# Patient Record
Sex: Male | Born: 1965 | Race: White | Hispanic: No | Marital: Married | Smoking: Never smoker
Health system: Southern US, Community
[De-identification: ages and names within clinical notes are randomized; demographics above are authoritative.]

## PROBLEM LIST (undated history)

## (undated) DIAGNOSIS — Z87442 Personal history of urinary calculi: Secondary | ICD-10-CM

## (undated) DIAGNOSIS — K219 Gastro-esophageal reflux disease without esophagitis: Secondary | ICD-10-CM

## (undated) DIAGNOSIS — N200 Calculus of kidney: Secondary | ICD-10-CM

## (undated) DIAGNOSIS — N529 Male erectile dysfunction, unspecified: Secondary | ICD-10-CM

## (undated) HISTORY — DX: Calculus of kidney: N20.0

## (undated) HISTORY — DX: Male erectile dysfunction, unspecified: N52.9

---
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
## 1986-09-16 HISTORY — PX: LEG SURGERY: SHX1003

## 2001-09-16 HISTORY — PX: SHOULDER SURGERY: SHX246

## 2004-09-26 ENCOUNTER — Emergency Department: Payer: Self-pay | Admitting: Emergency Medicine

## 2008-01-10 ENCOUNTER — Other Ambulatory Visit: Payer: Self-pay

## 2008-01-10 ENCOUNTER — Observation Stay: Payer: Self-pay | Admitting: Internal Medicine

## 2010-09-25 ENCOUNTER — Observation Stay: Payer: Self-pay | Admitting: Internal Medicine

## 2010-12-12 ENCOUNTER — Ambulatory Visit: Payer: Self-pay | Admitting: Family Medicine

## 2011-01-04 ENCOUNTER — Ambulatory Visit: Payer: Self-pay | Admitting: Internal Medicine

## 2012-07-27 ENCOUNTER — Emergency Department: Payer: Self-pay | Admitting: Emergency Medicine

## 2012-07-27 LAB — URINALYSIS, COMPLETE
Glucose,UR: NEGATIVE mg/dL (ref 0–75)
Leukocyte Esterase: NEGATIVE
Nitrite: NEGATIVE
Protein: NEGATIVE
RBC,UR: 2 /HPF (ref 0–5)
Squamous Epithelial: NONE SEEN
WBC UR: NONE SEEN /HPF (ref 0–5)

## 2012-07-27 LAB — COMPREHENSIVE METABOLIC PANEL
Albumin: 4.6 g/dL (ref 3.4–5.0)
Anion Gap: 3 — ABNORMAL LOW (ref 7–16)
Bilirubin,Total: 0.4 mg/dL (ref 0.2–1.0)
Calcium, Total: 9.2 mg/dL (ref 8.5–10.1)
Creatinine: 0.9 mg/dL (ref 0.60–1.30)
EGFR (African American): >60
EGFR (Non-African Amer.): >60
Glucose: 100 mg/dL — ABNORMAL HIGH (ref 65–99)
Osmolality: 274 (ref 275–301)
Potassium: 3.7 mmol/L (ref 3.5–5.1)
Sodium: 137 mmol/L (ref 136–145)
Total Protein: 7.6 g/dL (ref 6.4–8.2)

## 2012-07-27 LAB — LIPASE, BLOOD: Lipase: 103 U/L (ref 73–393)

## 2012-07-27 LAB — CBC
HCT: 45.5 % (ref 40.0–52.0)
HGB: 15.6 g/dL (ref 13.0–18.0)
MCHC: 34.3 g/dL (ref 32.0–36.0)
RDW: 12.7 % (ref 11.5–14.5)

## 2013-07-31 ENCOUNTER — Emergency Department: Payer: Self-pay | Admitting: Emergency Medicine

## 2017-06-24 ENCOUNTER — Ambulatory Visit (INDEPENDENT_AMBULATORY_CARE_PROVIDER_SITE_OTHER): Payer: Commercial Managed Care - PPO | Admitting: Family Medicine

## 2017-06-24 ENCOUNTER — Encounter: Payer: Self-pay | Admitting: Family Medicine

## 2017-06-24 VITALS — BP 136/86 | HR 68 | Temp 98.2°F | Resp 14 | Ht 69.0 in | Wt 196.0 lb

## 2017-06-24 DIAGNOSIS — N529 Male erectile dysfunction, unspecified: Secondary | ICD-10-CM | POA: Insufficient documentation

## 2017-06-24 DIAGNOSIS — F439 Reaction to severe stress, unspecified: Secondary | ICD-10-CM

## 2017-06-24 DIAGNOSIS — E559 Vitamin D deficiency, unspecified: Secondary | ICD-10-CM | POA: Diagnosis not present

## 2017-06-24 DIAGNOSIS — Z0000 Encounter for general adult medical examination without abnormal findings: Secondary | ICD-10-CM | POA: Diagnosis not present

## 2017-06-24 DIAGNOSIS — Z1211 Encounter for screening for malignant neoplasm of colon: Secondary | ICD-10-CM

## 2017-06-24 HISTORY — DX: Male erectile dysfunction, unspecified: N52.9

## 2017-06-24 LAB — CBC WITH DIFFERENTIAL/PLATELET
BASOS ABS: 32 {cells}/uL (ref 0–200)
Basophils Relative: 0.7 %
EOS ABS: 9 {cells}/uL — AB (ref 15–500)
Eosinophils Relative: 0.2 %
HCT: 45.2 % (ref 38.5–50.0)
Hemoglobin: 15.7 g/dL (ref 13.2–17.1)
Lymphs Abs: 1458 cells/uL (ref 850–3900)
MCH: 30.7 pg (ref 27.0–33.0)
MCHC: 34.7 g/dL (ref 32.0–36.0)
MCV: 88.3 fL (ref 80.0–100.0)
MONOS PCT: 6.3 %
MPV: 9.2 fL (ref 7.5–12.5)
NEUTROS PCT: 61.1 %
Neutro Abs: 2811 cells/uL (ref 1500–7800)
PLATELETS: 257 10*3/uL (ref 140–400)
RBC: 5.12 10*6/uL (ref 4.20–5.80)
RDW: 12.6 % (ref 11.0–15.0)
TOTAL LYMPHOCYTE: 31.7 %
WBC mixed population: 290 cells/uL (ref 200–950)
WBC: 4.6 10*3/uL (ref 3.8–10.8)

## 2017-06-24 LAB — COMPLETE METABOLIC PANEL WITH GFR
AG RATIO: 1.9 (calc) (ref 1.0–2.5)
ALBUMIN MSPROF: 4.8 g/dL (ref 3.6–5.1)
ALKALINE PHOSPHATASE (APISO): 68 U/L (ref 40–115)
ALT: 14 U/L (ref 9–46)
AST: 15 U/L (ref 10–40)
BILIRUBIN TOTAL: 0.7 mg/dL (ref 0.2–1.2)
BUN: 15 mg/dL (ref 7–25)
CO2: 28 mmol/L (ref 20–32)
Calcium: 9.9 mg/dL (ref 8.6–10.3)
Chloride: 105 mmol/L (ref 98–110)
Creat: 0.84 mg/dL (ref 0.60–1.35)
GFR, Est African American: 119 mL/min/{1.73_m2} (ref >=60)
GFR, Est Non African American: 103 mL/min/{1.73_m2} (ref >=60)
GLUCOSE: 90 mg/dL (ref 65–99)
Globulin: 2.5 g/dL (calc) (ref 1.9–3.7)
POTASSIUM: 4.1 mmol/L (ref 3.5–5.3)
Sodium: 141 mmol/L (ref 135–146)
Total Protein: 7.3 g/dL (ref 6.1–8.1)

## 2017-06-24 LAB — LIPID PANEL
CHOLESTEROL: 162 mg/dL (ref <200)
HDL: 39 mg/dL — ABNORMAL LOW (ref >40)
LDL CHOLESTEROL (CALC): 100 mg/dL — AB
Non-HDL Cholesterol (Calc): 123 mg/dL (calc) (ref <130)
Total CHOL/HDL Ratio: 4.2 (calc) (ref <5.0)
Triglycerides: 132 mg/dL (ref <150)

## 2017-06-24 MED ORDER — SILDENAFIL CITRATE 20 MG PO TABS: 20.0000 mg | ORAL_TABLET | Freq: Every day | ORAL | 5 refills | Status: DC | PRN

## 2017-06-24 NOTE — Patient Instructions (Addendum)

## 2017-06-24 NOTE — Assessment & Plan Note (Addendum)
Discussed risk of vascular disease associated with ER, vascular cause; suggested stress testing, cardiology work-up; patient declined; will check lipids, glucose

## 2017-06-24 NOTE — Assessment & Plan Note (Signed)
Check level, replace/supplement if needed

## 2017-06-24 NOTE — Assessment & Plan Note (Signed)
Order colonoscopy, after he turns 51

## 2017-06-24 NOTE — Progress Notes (Signed)
Patient ID: Jason Duncan, male   DOB: [birth year], 51 y.o.   MRN: [id number]   Subjective:   Jason Duncan is a 51 y.o. male here for a complete physical exam  Interim issues since last visit: none  He has ED; he would like Cialis; he has already had a stress test, passed with flying colors; discussed risk of coronary artery disease; arms will go to sleep, tingling, can't lay on left side, if he lays for too long on the left side, arm goes to sleep; feet go to burning; got so hot and ran water on them, felt so hot  USPSTF grade A and B recommendations Depression:  Depression screen [facility] 2/9 06/24/2017  Decreased Interest 0  Down, Depressed, Hopeless 2  PHQ - 2 Score 2  Altered sleeping 3  Tired, decreased energy 0  Change in appetite 0  Feeling bad or failure about yourself  1  Trouble concentrating 0  Moving slowly or fidgety/restless 0  Suicidal thoughts 1  PHQ-9 Score 7  Difficult doing work/chores Somewhat difficult  patient specifically denies SI/HI; would not act; no plan; just has issues, can't win for losing sometimes  Hypertension: does eat salt BP Readings from Last 3 Encounters:  06/24/17 136/86   Obesity: Wt Readings from Last 3 Encounters:  06/24/17 196 lb (88.9 kg)   BMI Readings from Last 3 Encounters:  06/24/17 28.94 kg/m    Alcohol: no Tobacco use: nonsmoker HIV, hep B, hep C: not intersted Married STD testing and prevention (chl/gon/syphilis): not interested Lipids and glucose: check todfay Colorectal cancer: no fam hx  Prostate cancer: at age 18 Lung cancer:  nonsmoker AAA: n/a Aspirin: consider at 62, check lipids Diet: typical American eater; lots of fruits and veggies; eats what he wants to eat, fried foods Exercise: lots of walking at work, [employer], walking through [employer] Skin cancer: one on his butt, but getting bigger, right, not symptomatic and not bleeding  Past Medical History:  Diagnosis Date  . Erectile dysfunction    . Erectile dysfunction 06/24/2017  . Kidney stones    Past Surgical History:  Procedure Laterality Date  . EXTERNAL EAR SURGERY Bilateral 1973  . LEG SURGERY Right 1988   rods placed due to broken leg  . SHOULDER SURGERY Right 2003   Family History  Problem Relation Age of Onset  . Hypertension Mother   . Pneumonia Maternal Grandmother   . Cancer Maternal Grandfather        lung  . Heart disease Brother        29 years old, died from a heart attack   Social History  Substance Use Topics  . Smoking status: Never Smoker  . Smokeless tobacco: Current User    Types: Chew  . Alcohol use No   Review of Systems  Constitutional: Negative for unexpected weight change.  Cardiovascular: Negative for chest pain.  was kept for 2 days in the hospital checked out, no heart attack  Objective:   Vitals:   06/24/17 0922  BP: 136/86  Pulse: 68  Resp: 14  Temp: 98.2 F (36.8 C)  TempSrc: Oral  SpO2: 97%  Weight: 196 lb (88.9 kg)  Height: 5\' 9"  (1.753 m)   Body mass index is 28.94 kg/m. Wt Readings from Last 3 Encounters:  06/24/17 196 lb (88.9 kg)   Physical Exam  Constitutional: He appears well-developed and well-nourished. No distress.  HENT:  Head: Normocephalic and atraumatic.  Nose: Nose normal.  Mouth/Throat:  Oropharynx is clear and moist.  Eyes: EOM are normal. No scleral icterus.  Neck: No JVD present. No thyromegaly present.  Cardiovascular: Normal rate, regular rhythm and normal heart sounds.   Pulmonary/Chest: Effort normal and breath sounds normal. No respiratory distress. He has no wheezes. He has no rales.  Abdominal: Soft. Bowel sounds are normal. He exhibits no distension. There is no tenderness. There is no guarding.  Musculoskeletal: Normal range of motion. He exhibits no edema.  Lymphadenopathy:    He has no cervical adenopathy.  Neurological: He is alert. He displays normal reflexes. He exhibits normal muscle tone. Coordination normal.  Skin: Skin is  warm and dry. No rash noted. He is not diaphoretic. No erythema. No pallor.  Psychiatric: He has a normal mood and affect. His behavior is normal. Judgment and thought content normal.    Assessment/Plan:   Problem List Items Addressed This Visit      Genitourinary   Erectile dysfunction    Discussed risk of vascular disease associated with ER, vascular cause; suggested stress testing, cardiology work-up; patient declined; will check lipids, glucose        Other   Vitamin D deficiency    Check level, replace/supplement if needed      Relevant Orders   VITAMIN D 25 Hydroxy (Vit-D Deficiency, Fractures) (Completed)   Colon cancer screening    Order colonoscopy, after he turns 51      Relevant Orders   Ambulatory referral to Gastroenterology   Preventative health care - Primary    USPSTF grade A and B recommendations reviewed with patient; age-appropriate recommendations, preventive care, screening tests, etc discussed and encouraged; healthy living encouraged; see AVS for patient education given to patient       Relevant Orders   CBC with Differential/Platelet (Completed)   COMPLETE METABOLIC PANEL WITH GFR (Completed)   Lipid panel (Completed)   TSH (Completed)    Other Visit Diagnoses    Stress       patient denies SI/HI; did not want medicine or counseling when offered; will check TSH, vit D      Meds ordered this encounter  Medications  . sildenafil (REVATIO) 20 MG tablet    Sig: Take 1-3 tablets (20-60 mg total) by mouth daily as needed.    Dispense:  30 tablet    Refill:  5   Orders Placed This Encounter  Procedures  . CBC with Differential/Platelet  . COMPLETE METABOLIC PANEL WITH GFR  . Lipid panel  . TSH  . VITAMIN D 25 Hydroxy (Vit-D Deficiency, Fractures)  . Ambulatory referral to Gastroenterology    Referral Priority:   Routine    Referral Type:   Consultation    Referral Reason:   Specialty Services Required    Number of Visits Requested:   1     Follow up plan: Return in about 1 year (around 06/24/2018) for complete physical.  An After Visit Summary was printed and given to the patient.

## 2017-06-25 LAB — VITAMIN D 25 HYDROXY (VIT D DEFICIENCY, FRACTURES): Vit D, 25-Hydroxy: 27 ng/mL — ABNORMAL LOW (ref 30–100)

## 2017-06-25 LAB — TSH: TSH: 1.71 mIU/L (ref 0.40–4.50)

## 2017-06-25 NOTE — Assessment & Plan Note (Signed)
USPSTF grade A and B recommendations reviewed with patient; age-appropriate recommendations, preventive care, screening tests, etc discussed and encouraged; healthy living encouraged; see AVS for patient education given to patient  

## 2017-07-28 ENCOUNTER — Other Ambulatory Visit: Payer: Self-pay | Admitting: Family Medicine

## 2017-07-28 MED ORDER — SILDENAFIL CITRATE 50 MG PO TABS: 25.0000 mg | ORAL_TABLET | Freq: Every day | ORAL | 5 refills | Status: DC | PRN

## 2017-07-28 NOTE — Progress Notes (Signed)
Sildenafil not covered by insurance; switch to viagra

## 2017-10-03 ENCOUNTER — Other Ambulatory Visit: Payer: Self-pay

## 2017-10-03 DIAGNOSIS — Z1211 Encounter for screening for malignant neoplasm of colon: Secondary | ICD-10-CM

## 2017-10-03 NOTE — Progress Notes (Signed)
Gastroenterology Pre-Procedure Review  Request Date: 1/25 Requesting Physician: Dr. Bonna Gains  PATIENT REVIEW QUESTIONS: The patient responded to the following health history questions as indicated:    1. Are you having any GI issues? no 2. Do you have a personal history of Polyps? no 3. Do you have a family history of Colon Cancer or Polyps? yes (No) 4. Diabetes Mellitus? no 5. Joint replacements in the past 12 months?no 6. Major health problems in the past 3 months?no 7. Any artificial heart valves, MVP, or defibrillator?no    MEDICATIONS & ALLERGIES:    Patient reports the following regarding taking any anticoagulation/antiplatelet therapy:   Plavix, Coumadin, Eliquis, Xarelto, Lovenox, Pradaxa, Brilinta, or Effient? no Aspirin? no  Patient confirms/reports the following medications:  Current Outpatient Medications  Medication Sig Dispense Refill  . sildenafil (VIAGRA) 50 MG tablet Take 0.5-1 tablets (25-50 mg total) daily as needed by mouth for erectile dysfunction. 10 tablet 5   No current facility-administered medications for this visit.     Patient confirms/reports the following allergies:  Allergies  Allergen Reactions  . Sulfur     No orders of the defined types were placed in this encounter.   AUTHORIZATION INFORMATION Primary Insurance: 1D#: Group #:  Secondary Insurance: 1D#: Group #:  SCHEDULE INFORMATION: Date: 1/25 Time: Location: [place]

## 2017-10-08 ENCOUNTER — Telehealth: Payer: Self-pay | Admitting: Gastroenterology

## 2017-10-08 NOTE — Telephone Encounter (Signed)
Notified [facility] regarding cancellation.

## 2017-10-08 NOTE — Telephone Encounter (Signed)
Patient has to cancel procedure on 10/10/17 with Dr. Bonna Gains due to family emergency.

## 2017-10-10 ENCOUNTER — Encounter: Admission: RE | Payer: Self-pay | Source: Ambulatory Visit

## 2017-10-10 ENCOUNTER — Ambulatory Visit
Admission: RE | Admit: 2017-10-10 | Payer: Commercial Managed Care - PPO | Source: Ambulatory Visit | Admitting: Gastroenterology

## 2017-10-10 SURGERY — COLONOSCOPY WITH PROPOFOL
Anesthesia: General

## 2018-07-03 ENCOUNTER — Telehealth: Payer: Self-pay | Admitting: Family Medicine

## 2018-07-03 NOTE — Telephone Encounter (Signed)
Patient is due for his physical Please schedule Thank you

## 2018-07-06 NOTE — Telephone Encounter (Signed)
Called [phone] @ 8:16 informing pt that prescription has been sent to pharmacy. Asked pt to return call to schedule appt for physical. Pt does have the option to see Benjamine Mola if Dr Sanda Klein is booked.

## 2018-07-08 NOTE — Telephone Encounter (Signed)
Pt is sch to see elizabeth tomorrow 07-09-18 at 9 am

## 2018-07-09 ENCOUNTER — Encounter: Payer: Self-pay | Admitting: Nurse Practitioner

## 2018-07-09 ENCOUNTER — Ambulatory Visit (INDEPENDENT_AMBULATORY_CARE_PROVIDER_SITE_OTHER): Payer: Commercial Managed Care - PPO | Admitting: Nurse Practitioner

## 2018-07-09 VITALS — BP 150/90 | HR 67 | Temp 98.5°F | Resp 16 | Ht 68.0 in | Wt 188.8 lb

## 2018-07-09 DIAGNOSIS — Z131 Encounter for screening for diabetes mellitus: Secondary | ICD-10-CM

## 2018-07-09 DIAGNOSIS — Z1211 Encounter for screening for malignant neoplasm of colon: Secondary | ICD-10-CM | POA: Diagnosis not present

## 2018-07-09 DIAGNOSIS — R0683 Snoring: Secondary | ICD-10-CM

## 2018-07-09 DIAGNOSIS — R350 Frequency of micturition: Secondary | ICD-10-CM

## 2018-07-09 DIAGNOSIS — Z0000 Encounter for general adult medical examination without abnormal findings: Secondary | ICD-10-CM

## 2018-07-09 DIAGNOSIS — H6123 Impacted cerumen, bilateral: Secondary | ICD-10-CM

## 2018-07-09 DIAGNOSIS — E559 Vitamin D deficiency, unspecified: Secondary | ICD-10-CM

## 2018-07-09 DIAGNOSIS — R030 Elevated blood-pressure reading, without diagnosis of hypertension: Secondary | ICD-10-CM

## 2018-07-09 DIAGNOSIS — Z13220 Encounter for screening for lipoid disorders: Secondary | ICD-10-CM

## 2018-07-09 DIAGNOSIS — G4700 Insomnia, unspecified: Secondary | ICD-10-CM

## 2018-07-09 DIAGNOSIS — R3915 Urgency of urination: Secondary | ICD-10-CM

## 2018-07-09 DIAGNOSIS — Z125 Encounter for screening for malignant neoplasm of prostate: Secondary | ICD-10-CM

## 2018-07-09 DIAGNOSIS — R351 Nocturia: Secondary | ICD-10-CM

## 2018-07-09 MED ORDER — TAMSULOSIN HCL 0.4 MG PO CAPS: 0.4000 mg | ORAL_CAPSULE | Freq: Every day | ORAL | 0 refills | Status: DC
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
## 2018-07-09 NOTE — Patient Instructions (Addendum)
It was great meeting you today Jason Duncan,  Here is a review of some of what we have discussed and some recommendations:   - Goal is 5 fruits and or vegetables a day: for now one serving of vegetable and fruit a day and slowly increase - Goal is for 8 glasses of water/ 64 ounces a day: for now 2 bottles of water a day and slowly increase - Cut down on salt.  - Increase activity to have some regular activity in during the week- 30 minutes 2 times a week; increase slowly to goal of 150 minutes a week; ex. Walking with the grandkids, swimming, jogging ect. Exercise 2 hours before sleep helps body rest faster as well.  - Please expect a phone call for your sleep study and colonscopy scheduling. If you do not receive a phone call within the next month let us know.  - please do not use q-tips as it pushed the ear wax further in your ear. - Please take a multi-vitamin with calcium, vitamin D and magnesium to help with you joint pains.  - Take your new medication to help with your urinary flow once a day (I recommend in the morning); let us know if you have any problems. - Follow-up in one month for blood pressure   _________________ Your goal blood pressure is less than 140 mmHg on top and under 90 on the bottom. Try to follow the DASH guidelines (DASH stands for Dietary Approaches to Stop Hypertension) Try to limit the sodium in your diet.  Ideally, consume less than 1.5 grams (less than 1,500mg ) per day. Do not add salt when cooking or at the table.  Check the sodium amount on labels when shopping, and choose items lower in sodium when given a choice. Avoid or limit foods that already contain a lot of sodium. Eat a diet rich in fruits and vegetables and whole grains. ___________________________ Sleep Hygiene Tips 1) Get regular. One of the best ways to train your body to sleep well is to go to bed and get up at more or less the same time every day, even on weekends and days off! This regular  rhythm will make you feel better and will give your body something to work from. 2) Sleep when sleepy. Only try to sleep when you actually feel tired or sleepy, rather than spending too much time awake in bed. 3) Get up & try again. If you haven't been able to get to sleep after about 20 minutes or more, get up and do something calming or boring until you feel sleepy, then return to bed and try again. Sit quietly on the couch with the lights off (bright light will tell your brain that it is time to wake up), or read something boring like the phone book. Avoid doing anything that is too stimulating or interesting, as this will wake you up even more. 4) Avoid caffeine & nicotine. It is best to avoid consuming any caffeine (in coffee, tea, cola drinks, chocolate, and some medications) or nicotine (cigarettes) for at least 4-6 hours before going to bed. These substances act as stimulants and interfere with the ability to fall asleep 5) Avoid alcohol. It is also best to avoid alcohol for at least 4-6 hours before going to bed. Many people believe that alcohol is relaxing and helps them to get to sleep at first, but it actually interrupts the quality of sleep. 6) Bed is for sleeping. Try not to use your bed for  anything other than sleeping and sex, so that your body comes to associate bed with sleep. If you use bed as a place to watch TV, eat, read, work on your laptop, pay bills, and other things, your body will not learn this Connection. 7) No naps. It is best to avoid taking naps during the day, to make sure that you are tired at bedtime. If you can't make it through the day without a nap, make sure it is for less than an hour and before 3pm. 8) Sleep rituals. You can develop your own rituals of things to remind your body that it is time to sleep - some people find it useful to do relaxing stretches or breathing exercises for 15 minutes before bed each night, or sit calmly with a  cup of caffeine-free tea. 9) Bathtime. Having a hot bath 1-2 hours before bedtime can be useful, as it will raise your body temperature, causing you to feel sleepy as your body temperature drops again. Research shows that sleepiness is associated with a drop in body temperature. 10) No clock-watching. Many people who struggle with sleep tend to watch the clock too much. Frequently checking the clock during the night can wake you up (especially if you turn on the light to read the time) and reinforces negative thoughts such as "Oh no, look how late it is, I'll never get to sleep" or "it's so early, I have only slept for 5 hours, this is terrible." 11) Use a sleep diary. This worksheet can be a useful way of making sure you have the right facts about your sleep, rather than making assumptions. Because a diary involves watching the clock (see point 10) it is a good idea to only use it for two weeks to get an idea of what is going and then perhaps two months down the track to see how you are progressing. 12) Exercise. Regular exercise is a good idea to help with good sleep, but try not to do strenuous exercise in the 4 hours before bedtime. Morning walks are a great way to start the day feeling refreshed! 13) Eat right. A healthy, balanced diet will help you to sleep well, but timing is important. Some people find that a very empty stomach at bedtime is distracting, so it can be useful to have a light snack, but a heavy meal soon before bed can also interrupt sleep. Some people recommend a warm glass of milk, which contains tryptophan, which acts as a natural sleep inducer. 14) The right space. It is very important that your bed and bedroom are quiet and comfortable for sleeping. A cooler room with enough blankets to stay warm is best, and make sure you have curtains or an eyemask to block out early morning light and earplugs if there is noise outside your room. 15) Keep daytime  routine the same. Even if you have a bad night sleep and are tired it is important that you try to keep your daytime activities the same as you had planned. That is, don't avoid activities because you feel tired. This can reinforce the insomnia. __________________  For earwax build up use ONE of these over the counter options to soften up with ear wax in your ear: mineral oil drops, docusate (liquid), debrox. Place a 2 drops in your ear while laying on your left side- Keep laying for 30-60 minutes on that side after application. Can use once daily to soften ear wax. Wash in shower afterwards,  . do  not stick anything in your ear as it further impact the ear wax

## 2018-07-10 LAB — COMPLETE METABOLIC PANEL WITH GFR
AG RATIO: 2 (calc) (ref 1.0–2.5)
ALKALINE PHOSPHATASE (APISO): 77 U/L (ref 40–115)
ALT: 11 U/L (ref 9–46)
AST: 16 U/L (ref 10–35)
Albumin: 5 g/dL (ref 3.6–5.1)
BILIRUBIN TOTAL: 0.6 mg/dL (ref 0.2–1.2)
BUN: 14 mg/dL (ref 7–25)
CHLORIDE: 103 mmol/L (ref 98–110)
CO2: 27 mmol/L (ref 20–32)
CREATININE: 0.88 mg/dL (ref 0.70–1.33)
Calcium: 10.1 mg/dL (ref 8.6–10.3)
GFR, EST AFRICAN AMERICAN: 116 mL/min/{1.73_m2} (ref >=60)
GFR, Est Non African American: 100 mL/min/{1.73_m2} (ref >=60)
GLUCOSE: 90 mg/dL (ref 65–99)
Globulin: 2.5 g/dL (calc) (ref 1.9–3.7)
Potassium: 4.2 mmol/L (ref 3.5–5.3)
SODIUM: 140 mmol/L (ref 135–146)
Total Protein: 7.5 g/dL (ref 6.1–8.1)

## 2018-07-10 LAB — PSA: PSA: 1.2 ng/mL (ref <=4.0)

## 2018-07-10 LAB — LIPID PANEL
Cholesterol: 178 mg/dL (ref <200)
HDL: 37 mg/dL — AB (ref >40)
LDL CHOLESTEROL (CALC): 119 mg/dL — AB
NON-HDL CHOLESTEROL (CALC): 141 mg/dL — AB (ref <130)
TRIGLYCERIDES: 113 mg/dL (ref <150)
Total CHOL/HDL Ratio: 4.8 (calc) (ref <5.0)

## 2018-07-10 LAB — URINALYSIS, ROUTINE W REFLEX MICROSCOPIC
Bacteria, UA: NONE SEEN /HPF
Bilirubin Urine: NEGATIVE
GLUCOSE, UA: NEGATIVE
HGB URINE DIPSTICK: NEGATIVE
HYALINE CAST: NONE SEEN /LPF
KETONES UR: NEGATIVE
Leukocytes, UA: NEGATIVE
Nitrite: NEGATIVE
PH: 5.5 (ref 5.0–8.0)
RBC / HPF: NONE SEEN /HPF (ref 0–2)
SPECIFIC GRAVITY, URINE: 1.023 (ref 1.001–1.03)
Squamous Epithelial / LPF: NONE SEEN /HPF (ref <=5)
WBC UA: NONE SEEN /HPF (ref 0–5)

## 2018-07-10 LAB — VITAMIN D 25 HYDROXY (VIT D DEFICIENCY, FRACTURES): Vit D, 25-Hydroxy: 28 ng/mL — ABNORMAL LOW (ref 30–100)

## 2018-07-23 ENCOUNTER — Encounter: Payer: Self-pay | Admitting: *Deleted

## 2018-08-10 ENCOUNTER — Ambulatory Visit (INDEPENDENT_AMBULATORY_CARE_PROVIDER_SITE_OTHER): Payer: Commercial Managed Care - PPO | Admitting: Family Medicine

## 2018-08-10 ENCOUNTER — Encounter: Payer: Self-pay | Admitting: Family Medicine

## 2018-08-10 VITALS — BP 122/78 | HR 74 | Temp 97.8°F | Ht 68.0 in | Wt 188.7 lb

## 2018-08-10 DIAGNOSIS — M79672 Pain in left foot: Secondary | ICD-10-CM | POA: Diagnosis not present

## 2018-08-10 DIAGNOSIS — R030 Elevated blood-pressure reading, without diagnosis of hypertension: Secondary | ICD-10-CM

## 2018-08-10 DIAGNOSIS — I7381 Erythromelalgia: Secondary | ICD-10-CM

## 2018-08-10 DIAGNOSIS — M79671 Pain in right foot: Secondary | ICD-10-CM | POA: Diagnosis not present

## 2018-08-10 NOTE — Progress Notes (Signed)
BP 122/78   Pulse 74   Temp 97.8 F (36.6 C) (Oral)   Ht 5\' 8"  (1.727 m)   Wt 188 lb 11.2 oz (85.6 kg)   SpO2 94%   BMI 28.69 kg/m    Subjective:    Patient ID: Jason Duncan, male    DOB: April 03, 1966, 52 y.o.   MRN: [id number]  HPI: Jason Duncan is a 52 y.o. male  Chief Complaint  Patient presents with  . Follow-up    HPI He will go Dec 12th to see the lung doctor; snoring real bad; has to sleep in separate bedrooms; wife thinks he stops breathing; does not feel rested and refreshed  Both feet feel hot like they are burning; worse at night; nothing at all during the day; as soon as he gets in the bed; laying on the couch is okay; different beds are okay; they do not go cold or purple; they got so hot one night, he thought he saw smoke come off his feet; not as bad as it used to be; he checked a temperature on his feet one time, but couldn't get a good reading; not sweating; hands do not get that way; has to keep feet out from under the covers; mother has DM; no one with rheumatoid arthritis or lupus  High blood pressure at last visit; 150/90 at last visit in October; just that day; cut back on salt, trying to eat fruits and veggies daily  Depression screen [facility] 2/9 08/10/2018 07/09/2018 06/24/2017  Decreased Interest 0 0 0  Down, Depressed, Hopeless 0 0 2  PHQ - 2 Score 0 0 2  Altered sleeping 0 - 3  Tired, decreased energy 0 - 0  Change in appetite 0 - 0  Feeling bad or failure about yourself  0 - 1  Trouble concentrating 0 - 0  Moving slowly or fidgety/restless 0 - 0  Suicidal thoughts 0 - 1  PHQ-9 Score 0 - 7  Difficult doing work/chores Not difficult at all - Somewhat difficult   Fall Risk  08/10/2018 07/09/2018 06/24/2017  Falls in the past year? 0 No No  Number falls in past yr: 0 - -    Relevant past medical, surgical, family and social history reviewed Past Medical History:  Diagnosis Date  . Erectile dysfunction   . Erectile dysfunction 06/24/2017    . Kidney stones    Past Surgical History:  Procedure Laterality Date  . EXTERNAL EAR SURGERY Bilateral 1973  . LEG SURGERY Right 1988   rods placed due to broken leg  . SHOULDER SURGERY Right 2003   Family History  Problem Relation Age of Onset  . Hypertension Mother   . Pneumonia Maternal Grandmother   . Cancer Maternal Grandfather        lung  . Heart disease Brother        2 years old, died from a heart attack   Social History   Tobacco Use  . Smoking status: Never Smoker  . Smokeless tobacco: Current User    Types: Chew  Substance Use Topics  . Alcohol use: No  . Drug use: No    Comment: used to smoke pot from [age] y old      [facility] Visit from 08/10/2018 in [facility]  AUDIT-C Score  0      Interim medical history since last visit reviewed. Allergies and medications reviewed  Review of Systems Per HPI unless specifically indicated above  Objective:    BP 122/78   Pulse 74   Temp 97.8 F (36.6 C) (Oral)   Ht 5\' 8"  (1.727 m)   Wt 188 lb 11.2 oz (85.6 kg)   SpO2 94%   BMI 28.69 kg/m   Wt Readings from Last 3 Encounters:  08/10/18 188 lb 11.2 oz (85.6 kg)  07/09/18 188 lb 12.8 oz (85.6 kg)  06/24/17 196 lb (88.9 kg)    Physical Exam  Constitutional: He appears well-developed and well-nourished. No distress.  Eyes: No scleral icterus.  Cardiovascular: Normal rate.  Pulses:      Dorsalis pedis pulses are 2+ on the right side.  Right foot warm and appears well-  Pulmonary/Chest: Effort normal.  Neurological: He is alert.  Sensation intact all toes on the right foot  Skin: No pallor.  Psychiatric: He has a normal mood and affect.      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  An after-visit summary was printed and given to the patient at [place].  Please see the patient instructions which may contain other information and recommendations beyond what is mentioned above in the assessment and plan.  No orders of the defined types were placed in this encounter.   Orders Placed This Encounter  Procedures  . Ambulatory referral to Rheumatology

## 2018-08-10 NOTE — Patient Instructions (Addendum)
We'll have you see the rheumatologist If you have not heard anything from my staff in a week about any orders/referrals/studies from today, please contact us here to follow-up [phone]  Try to follow the DASH guidelines (DASH stands for Dietary Approaches to Stop Hypertension). Try to limit the sodium in your diet to no more than 1,500mg  of sodium per day. Certainly try to not exceed 2,000 mg per day at the very most. Do not add salt when cooking or at the table.  Check the sodium amount on labels when shopping, and choose items lower in sodium when given a choice. Avoid or limit foods that already contain a lot of sodium. Eat a diet rich in fruits and vegetables and whole grains, and try to lose weight if overweight or obese   DASH Eating Plan DASH stands for "Dietary Approaches to Stop Hypertension." The DASH eating plan is a healthy eating plan that has been shown to reduce high blood pressure (hypertension). It may also reduce your risk for type 2 diabetes, heart disease, and stroke. The DASH eating plan may also help with weight loss. What are tips for following this plan? General guidelines  Avoid eating more than 2,300 mg (milligrams) of salt (sodium) a day. If you have hypertension, you may need to reduce your sodium intake to 1,500 mg a day.  Limit alcohol intake to no more than 1 drink a day for nonpregnant women and 2 drinks a day for men. One drink equals 12 oz of beer, 5 oz of wine, or 1 oz of hard liquor.  Work with your health care provider to maintain a healthy body weight or to lose weight. Ask what an ideal weight is for you.  Get at least 30 minutes of exercise that causes your heart to beat faster (aerobic exercise) most days of the week. Activities may include walking, swimming, or biking.  Work with your health care provider or diet and nutrition specialist (dietitian) to adjust your eating plan to your individual calorie needs. Reading food labels  Check food  labels for the amount of sodium per serving. Choose foods with less than 5 percent of the Daily Value of sodium. Generally, foods with less than 300 mg of sodium per serving fit into this eating plan.  To find whole grains, look for the word "whole" as the first word in the ingredient list. Shopping  Buy products labeled as "low-sodium" or "no salt added."  Buy fresh foods. Avoid canned foods and premade or frozen meals. Cooking  Avoid adding salt when cooking. Use salt-free seasonings or herbs instead of table salt or sea salt. Check with your health care provider or pharmacist before using salt substitutes.  Do not fry foods. Cook foods using healthy methods such as baking, boiling, grilling, and broiling instead.  Cook with heart-healthy oils, such as olive, canola, soybean, or sunflower oil. Meal planning   Eat a balanced diet that includes: ? 5 or more servings of fruits and vegetables each day. At each meal, try to fill half of your plate with fruits and vegetables. ? Up to 6-8 servings of whole grains each day. ? Less than 6 oz of lean meat, poultry, or fish each day. A 3-oz serving of meat is about the same size as a deck of cards. One egg equals 1 oz. ? 2 servings of low-fat dairy each day. ? A serving of nuts, seeds, or beans 5 times each week. ? Heart-healthy fats. Healthy fats called Omega-3 fatty  acids are found in foods such as flaxseeds and coldwater fish, like sardines, salmon, and mackerel.  Limit how much you eat of the following: ? Canned or prepackaged foods. ? Food that is high in trans fat, such as fried foods. ? Food that is high in saturated fat, such as fatty meat. ? Sweets, desserts, sugary drinks, and other foods with added sugar. ? Full-fat dairy products.  Do not salt foods before eating.  Try to eat at least 2 vegetarian meals each week.  Eat more home-cooked food and less restaurant, buffet, and fast food.  When eating at a restaurant, ask that  your food be prepared with less salt or no salt, if possible. What foods are recommended? The items listed may not be a complete list. Talk with your dietitian about what dietary choices are best for you. Grains Whole-grain or whole-wheat bread. Whole-grain or whole-wheat pasta. Brown rice. [place]. Bulgur. Whole-grain and low-sodium cereals. Pita bread. Low-fat, low-sodium crackers. Whole-wheat flour tortillas. Vegetables Fresh or frozen vegetables (raw, steamed, roasted, or grilled). Low-sodium or reduced-sodium tomato and vegetable juice. Low-sodium or reduced-sodium tomato sauce and tomato paste. Low-sodium or reduced-sodium canned vegetables. Fruits All fresh, dried, or frozen fruit. Canned fruit in natural juice (without added sugar). Meat and other protein foods Skinless chicken or [place]. Ground chicken or [place]. Pork with fat trimmed off. Fish and seafood. Egg whites. Dried beans, peas, or lentils. Unsalted nuts, nut butters, and seeds. Unsalted canned beans. Lean cuts of beef with fat trimmed off. Low-sodium, lean deli meat. Dairy Low-fat (1%) or fat-free (skim) milk. Fat-free, low-fat, or reduced-fat cheeses. Nonfat, low-sodium ricotta or cottage cheese. Low-fat or nonfat yogurt. Low-fat, low-sodium cheese. Fats and oils Soft margarine without trans fats. Vegetable oil. Low-fat, reduced-fat, or light mayonnaise and salad dressings (reduced-sodium). Canola, safflower, olive, soybean, and sunflower oils. Avocado. Seasoning and other foods Herbs. Spices. Seasoning mixes without salt. Unsalted popcorn and pretzels. Fat-free sweets. What foods are not recommended? The items listed may not be a complete list. Talk with your dietitian about what dietary choices are best for you. Grains Baked goods made with fat, such as croissants, muffins, or some breads. Dry pasta or rice meal packs. Vegetables Creamed or fried vegetables. Vegetables in a cheese sauce. Regular canned vegetables  (not low-sodium or reduced-sodium). Regular canned tomato sauce and paste (not low-sodium or reduced-sodium). Regular tomato and vegetable juice (not low-sodium or reduced-sodium). Angie Fava. Olives. Fruits Canned fruit in a light or heavy syrup. Fried fruit. Fruit in cream or butter sauce. Meat and other protein foods Fatty cuts of meat. Ribs. Fried meat. Berniece Salines. Sausage. Bologna and other processed lunch meats. Salami. Fatback. Hotdogs. Bratwurst. Salted nuts and seeds. Canned beans with added salt. Canned or smoked fish. Whole eggs or egg yolks. Chicken or [place] with skin. Dairy Whole or 2% milk, cream, and half-and-half. Whole or full-fat cream cheese. Whole-fat or sweetened yogurt. Full-fat cheese. Nondairy creamers. Whipped toppings. Processed cheese and cheese spreads. Fats and oils Butter. Stick margarine. Lard. Shortening. Ghee. Bacon fat. Tropical oils, such as coconut, palm kernel, or palm oil. Seasoning and other foods Salted popcorn and pretzels. Onion salt, garlic salt, seasoned salt, table salt, and sea salt. Worcestershire sauce. Tartar sauce. Barbecue sauce. Teriyaki sauce. Soy sauce, including reduced-sodium. Steak sauce. Canned and packaged gravies. Fish sauce. Oyster sauce. Cocktail sauce. Horseradish that you find on the shelf. Ketchup. Mustard. Meat flavorings and tenderizers. Bouillon cubes. Hot sauce and Tabasco sauce. Premade or packaged marinades. Premade or packaged  taco seasonings. Relishes. Regular salad dressings. Where to find more information:  National Heart, Lung, and [place]: https://wilson-eaton.com/  American Heart Association: www.heart.org Summary  The DASH eating plan is a healthy eating plan that has been shown to reduce high blood pressure (hypertension). It may also reduce your risk for type 2 diabetes, heart disease, and stroke.  With the DASH eating plan, you should limit salt (sodium) intake to 2,300 mg a day. If you have hypertension, you may need to  reduce your sodium intake to 1,500 mg a day.  When on the DASH eating plan, aim to eat more fresh fruits and vegetables, whole grains, lean proteins, low-fat dairy, and heart-healthy fats.  Work with your health care provider or diet and nutrition specialist (dietitian) to adjust your eating plan to your individual calorie needs. This information is not intended to replace advice given to you by your health care provider. Make sure you discuss any questions you have with your health care provider. Document Released: 08/22/2011 Document Revised: 08/26/2016 Document Reviewed: 08/26/2016 Elsevier Interactive Patient Education  [employer].

## 2018-08-27 ENCOUNTER — Encounter: Payer: Self-pay | Admitting: Internal Medicine

## 2018-08-27 ENCOUNTER — Ambulatory Visit (INDEPENDENT_AMBULATORY_CARE_PROVIDER_SITE_OTHER): Payer: Commercial Managed Care - PPO | Admitting: Internal Medicine

## 2018-08-27 VITALS — BP 146/100 | HR 74 | Ht 67.5 in | Wt 185.8 lb

## 2018-08-27 DIAGNOSIS — G4719 Other hypersomnia: Secondary | ICD-10-CM | POA: Diagnosis not present

## 2018-08-27 NOTE — Patient Instructions (Addendum)
Will send for sleep study.  Will need to obtain results of previous sleep study from sleep med.  Please call Dr. Delight Ovens office today to be seen asap.    Sleep Apnea    Sleep apnea is disorder that affects a person's sleep. A person with sleep apnea has abnormal pauses in their breathing when they sleep. It is hard for them to get a good sleep. This makes a person tired during the day. It also can lead to other physical problems. There are three types of sleep apnea. One type is when breathing stops for a short time because your airway is blocked (obstructive sleep apnea). Another type is when the brain sometimes fails to give the normal signal to breathe to the muscles that control your breathing (central sleep apnea). The third type is a combination of the other two types.  HOME CARE   Take all medicine as told by your doctor.  Avoid alcohol, calming medicines (sedatives), and depressant drugs.  Try to lose weight if you are overweight. Talk to your doctor about a healthy weight goal.  Your doctor may have you use a device that helps to open your airway. It can help you get the air that you need. It is called a positive airway pressure (PAP) device.   MAKE SURE YOU:   Understand these instructions.  Will watch your condition.  Will get help right away if you are not doing well or get worse.  It may take approximately 1 month for you to get used to wearing her CPAP every night.  Be sure to work with your machine to get used to it, be patient, it may take time!  If you have trouble tolerating CPAP DO NOT RETURN YOUR MACHINE; Contact our office to see if we can help you tolerate the CPAP better first!

## 2018-08-27 NOTE — Progress Notes (Signed)
[facility] Consultation      Assessment and Plan:  Excessive daytime sleepiness.  --Symptoms and signs of sleep apnea.  --Will send for sleep study.  --Will need to obtain results of previous sleep study which he apparently had about 5 years ago at sleep med.   Orders Placed This Encounter  Procedures  . Home sleep test   Return in about 3 months (around 11/26/2018).    Date: 08/27/2018  MRN# [id number] [name] June 14, 1966  Referring Physician: Dr. Sanda Klein for possible sleep apnea.   [facility] Duncan is a 52 y.o. old male seen in consultation for chief complaint of:    Chief Complaint  Patient presents with  . Consult    States he has been snoring for years. States he had a sleep study 5-6 years ago. Last night he had a episode where he woke up and was feeling very faint and profusely sweating. States his BP at the time was 126 74.     HPI:   The patient is a 52 yo male, he snores loudly and now has to sleep in a separate bedroom from his wife. He apparently had a sleep study about 5 years ago, but does not know results but he was never told to start CPAP. Symptoms have progressed since that time and his wife will often wake him up.  Patient typically goes to bed around 11 PM, wakes up at 7 AM. He is very tired during the day, and at night he has trouble falling asleep.  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  Denies sleep paralysis, sleep walking, no cataplexy.   He recently had an episode where he became sweaty and dizzy overnight. He has a brother who died of a heart attack at the age of 28. He tells me that he had a negative stress test about 5 years ago.   Denies TMJ, denies jaw pain. He has dentures.    PMHX:   Past Medical History:  Diagnosis Date  . Erectile dysfunction   . Erectile dysfunction 06/24/2017  . Kidney stones    Surgical Hx:  Past Surgical History:  Procedure Laterality Date  . EXTERNAL EAR SURGERY  Bilateral 1973  . LEG SURGERY Right 1988   rods placed due to broken leg  . SHOULDER SURGERY Right 2003   Family Hx:  Family History  Problem Relation Age of Onset  . Hypertension Mother   . Pneumonia Maternal Grandmother   . Cancer Maternal Grandfather        lung  . Heart disease Brother        58 years old, died from a heart attack   Social Hx:   Social History   Tobacco Use  . Smoking status: Never Smoker  . Smokeless tobacco: Current User    Types: Chew  Substance Use Topics  . Alcohol use: No  . Drug use: No    Comment: used to smoke pot from [age] y old    Medication:    Current Outpatient Medications:  .  sildenafil (REVATIO) 20 MG tablet, TAKE ONE TO THREE TABLETS BY MOUTH DAILY AS NEEDED, Disp: 30 tablet, Rfl: 0   Allergies:  Sulfur  Review of Systems: Gen:  Denies  fever, sweats, chills HEENT: Denies blurred vision, double vision. bleeds, sore throat Cvc:  No dizziness, chest pain. Resp:   Denies cough or sputum production, shortness of breath Gi: Denies swallowing difficulty, stomach pain. Gu:  Denies bladder incontinence,  burning urine Ext:   No Joint pain, stiffness. Skin: No skin rash,  hives  Endoc:  No polyuria, polydipsia. Psych: No depression, insomnia. Other:  All other systems were reviewed with the patient and were negative other that what is mentioned in the HPI.   Physical Examination:   VS: BP (!) 146/100   Pulse 74   Ht 5' 7.5" (1.715 m)   Wt 185 lb 12.8 oz (84.3 kg)   SpO2 98%   BMI 28.67 kg/m   General Appearance: No distress  Neuro:without focal findings,  speech normal,  HEENT: PERRLA, EOM intact.  [place] 3.  Pulmonary: normal breath sounds, No wheezing.  CardiovascularNormal S1,S2.  No m/r/g.   Abdomen: Benign, Soft, non-tender. Renal:  No costovertebral tenderness  GU:  No performed at this time. Endoc: No evident thyromegaly, no signs of acromegaly. Skin:   warm, no rashes, no ecchymosis  Extremities: normal, no  cyanosis, clubbing.  Other findings:    LABORATORY PANEL:   CBC No results for input(s): WBC, HGB, HCT, PLT in the last 168 hours. ------------------------------------------------------------------------------------------------------------------  Chemistries  No results for input(s): NA, K, CL, CO2, GLUCOSE, BUN, CREATININE, CALCIUM, MG, AST, ALT, ALKPHOS, BILITOT in the last 168 hours.  Invalid input(s): GFRCGP ------------------------------------------------------------------------------------------------------------------  Cardiac Enzymes No results for input(s): TROPONINI in the last 168 hours. ------------------------------------------------------------  RADIOLOGY:  No results found.     Thank  you for the consultation and for allowing [facility] to assist in the care of your patient. Our recommendations are noted above.  Please contact us if we can be of further service.   Marda Stalker, M.D., F.C.C.P.  Board Certified in Internal Medicine, Pulmonary Medicine, [place], and Sleep Medicine.  [facility] Office Number: [phone]   08/27/2018

## 2018-09-23 DIAGNOSIS — G4733 Obstructive sleep apnea (adult) (pediatric): Secondary | ICD-10-CM

## 2018-09-24 DIAGNOSIS — G4733 Obstructive sleep apnea (adult) (pediatric): Secondary | ICD-10-CM

## 2018-09-25 ENCOUNTER — Telehealth: Payer: Self-pay

## 2018-09-25 DIAGNOSIS — G4733 Obstructive sleep apnea (adult) (pediatric): Secondary | ICD-10-CM

## 2018-09-25 NOTE — Telephone Encounter (Signed)
LM on VM regarding sleep study results.  Severe OSA with AHI of 33. Central apnea index of 6, uncertain etiology, though suspect this is artefactual.   Recommend auto-CPAP with pressure range of 5-20 cm H2O. Close follow-up of download data to ensure central apneas resolve.

## 2018-09-28 NOTE — Telephone Encounter (Signed)
Patient returning phone call..  Can be reached after 4:00 pm  Phone number is [phone].

## 2018-09-28 NOTE — Telephone Encounter (Signed)
Pt aware Orders placed Nothing further needed. 

## 2018-10-20 ENCOUNTER — Other Ambulatory Visit: Payer: Self-pay

## 2018-10-20 DIAGNOSIS — G4719 Other hypersomnia: Secondary | ICD-10-CM

## 2019-02-02 ENCOUNTER — Other Ambulatory Visit: Payer: Self-pay | Admitting: Family Medicine

## 2019-02-02 MED ORDER — SILDENAFIL CITRATE 20 MG PO TABS: ORAL_TABLET | ORAL | 0 refills | Status: DC

## 2019-02-02 NOTE — Telephone Encounter (Signed)
Copied from [place] [id number]. Topic: Quick Communication - Rx Refill/Question >> Feb 02, 2019 11:49 AM Richardo Priest, NT wrote: Medication:  sildenafil (REVATIO) 20 MG tablet   Has the patient contacted their pharmacy? No patient states he saw no refills and called.   Preferred Pharmacy (with phone number or street name):  [facility] [address] [place] - [place] [phone] (Phone) [phone] (Fax)  Agent: Please be advised that RX refills may take up to 3 business days. We ask that you follow-up with your pharmacy.

## 2019-02-27 ENCOUNTER — Other Ambulatory Visit: Payer: Self-pay

## 2019-02-27 ENCOUNTER — Emergency Department: Payer: Commercial Managed Care - PPO

## 2019-02-27 ENCOUNTER — Encounter: Payer: Self-pay | Admitting: Emergency Medicine

## 2019-02-27 ENCOUNTER — Emergency Department
Admission: EM | Admit: 2019-02-27 | Discharge: 2019-02-27 | Disposition: A | Discharge status: Home / Self Care | Payer: Commercial Managed Care - PPO | Attending: Emergency Medicine | Admitting: Emergency Medicine

## 2019-02-27 DIAGNOSIS — K449 Diaphragmatic hernia without obstruction or gangrene: Secondary | ICD-10-CM

## 2019-02-27 DIAGNOSIS — R0789 Other chest pain: Secondary | ICD-10-CM | POA: Diagnosis not present

## 2019-02-27 DIAGNOSIS — R1012 Left upper quadrant pain: Secondary | ICD-10-CM

## 2019-02-27 DIAGNOSIS — F17220 Nicotine dependence, chewing tobacco, uncomplicated: Secondary | ICD-10-CM | POA: Insufficient documentation

## 2019-02-27 LAB — TROPONIN I
Troponin I: <0.03 ng/mL (ref <0.03)
Troponin I: <0.03 ng/mL (ref <0.03)

## 2019-02-27 LAB — PROTIME-INR
INR: 1.1 (ref 0.8–1.2)
Prothrombin Time: 13.6 seconds (ref 11.4–15.2)

## 2019-02-27 LAB — BASIC METABOLIC PANEL
Anion gap: 14 (ref 5–15)
BUN: 13 mg/dL (ref 6–20)
CO2: 23 mmol/L (ref 22–32)
Calcium: 10.2 mg/dL (ref 8.9–10.3)
Chloride: 104 mmol/L (ref 98–111)
Creatinine, Ser: 0.95 mg/dL (ref 0.61–1.24)
GFR calc Af Amer: >60 mL/min (ref >60)
GFR calc non Af Amer: >60 mL/min (ref >60)
Glucose, Bld: 103 mg/dL — ABNORMAL HIGH (ref 70–99)
Potassium: 3.5 mmol/L (ref 3.5–5.1)
Sodium: 141 mmol/L (ref 135–145)

## 2019-02-27 LAB — CBC
HCT: 47.3 % (ref 39.0–52.0)
Hemoglobin: 16.2 g/dL (ref 13.0–17.0)
MCH: 30.8 pg (ref 26.0–34.0)
MCHC: 34.2 g/dL (ref 30.0–36.0)
MCV: 89.9 fL (ref 80.0–100.0)
Platelets: 303 10*3/uL (ref 150–400)
RBC: 5.26 MIL/uL (ref 4.22–5.81)
RDW: 11.9 % (ref 11.5–15.5)
WBC: 6.6 10*3/uL (ref 4.0–10.5)
nRBC: 0 % (ref 0.0–0.2)

## 2019-02-27 LAB — HEPATIC FUNCTION PANEL
ALT: 13 U/L (ref 0–44)
AST: 17 U/L (ref 15–41)
Albumin: 5.4 g/dL — ABNORMAL HIGH (ref 3.5–5.0)
Alkaline Phosphatase: 59 U/L (ref 38–126)
Bilirubin, Direct: 0.1 mg/dL (ref 0.0–0.2)
Indirect Bilirubin: 0.8 mg/dL (ref 0.3–0.9)
Total Bilirubin: 0.9 mg/dL (ref 0.3–1.2)
Total Protein: 8.3 g/dL — ABNORMAL HIGH (ref 6.5–8.1)

## 2019-02-27 LAB — LIPASE, BLOOD: Lipase: 30 U/L (ref 11–51)

## 2019-02-27 MED ORDER — IOHEXOL 240 MG/ML SOLN
50.0000 mL | Freq: Once | INTRAMUSCULAR | Status: AC | PRN
  Administered 2019-02-27: 50 mL via ORAL

## 2019-02-27 MED ORDER — ONDANSETRON HCL 4 MG/2ML IJ SOLN
4.0000 mg | Freq: Once | INTRAMUSCULAR | Status: AC
  Administered 2019-02-27: 4 mg via INTRAVENOUS
  Filled 2019-02-27: qty 2

## 2019-02-27 MED ORDER — IOHEXOL 300 MG/ML  SOLN
100.0000 mL | Freq: Once | INTRAMUSCULAR | Status: AC | PRN
  Administered 2019-02-27: 100 mL via INTRAVENOUS

## 2019-02-27 MED ORDER — ONDANSETRON 4 MG PO TBDP: 4.0000 mg | ORAL_TABLET | Freq: Four times a day (QID) | ORAL | 0 refills | Status: DC | PRN

## 2019-02-27 MED ORDER — SODIUM CHLORIDE 0.9 % IV BOLUS
1000.0000 mL | Freq: Once | INTRAVENOUS | Status: AC
  Administered 2019-02-27: 1000 mL via INTRAVENOUS

## 2019-02-27 NOTE — Discharge Instructions (Addendum)
You were seen in the emergency room for abdominal pain. It is important that you follow up closely with your primary care doctor in the next couple of days.  Please return to the emergency room right away if you are to develop a fever, severe nausea, worsening or concerning chest pain, your pain becomes severe or worsens, you are unable to keep food down, begin vomiting any dark or bloody fluid, you develop any dark or bloody stools, feel dehydrated, or other new concerns or symptoms arise.

## 2019-02-27 NOTE — ED Notes (Addendum)
Returned from CT, patient made aware of hat placed on toilet for collection of stools, verbalized understanding.

## 2019-02-27 NOTE — ED Triage Notes (Addendum)
Here for diarrhea X 2 days and left chest tightness.  Had NVD last week after eating fish sandwich.  Also reports that pulled tick off his right leg the other day. No fever. VSS.  Nausea currently, no vomiting. Reports that he was around someone who had been around someone with covid.  Unlabored.  4mg  zofran by EMS

## 2019-02-27 NOTE — ED Notes (Signed)
Urine specimen

## 2019-02-27 NOTE — ED Notes (Signed)
Patient denies any discomforts at present time. Repeat trop results pending. disposition pending.

## 2019-02-27 NOTE — ED Notes (Signed)
Off unit to ct 

## 2019-02-27 NOTE — ED Notes (Signed)
PCX completed. Dr. Jacqualine Code at bedside to assess and plan further care.

## 2019-02-27 NOTE — ED Notes (Signed)
CT tech at bedside, Po started drinking PO  contrast

## 2019-02-27 NOTE — ED Provider Notes (Signed)
-----------------------------------------   5:00 PM on 02/27/2019 -----------------------------------------  Patient care assumed from Dr. Jacqualine Code.  Patient's repeat troponin is negative.  We will discharge the patient with PCP follow-up.   Harvest Dark, MD 02/27/19 1700

## 2019-02-27 NOTE — ED Notes (Signed)
Lab called, will run blood work that was held due to no orders.

## 2019-02-27 NOTE — ED Provider Notes (Signed)
ED ECG REPORT I, Delman Kitten, the attending physician, personally viewed and interpreted this ECG.  Date: 02/27/2019 EKG Time: 1617 Rate: 60 Rhythm: normal sinus rhythm QRS Axis: normal Intervals: normal ST/T Wave abnormalities: normal Narrative Interpretation: no evidence of acute ischemia    Delman Kitten, MD 02/27/19 1243

## 2019-02-27 NOTE — ED Notes (Signed)
Patient reports chest pain 4/10 that began yesterday, reports feels like a nagging pain with tightness, pain comes and goes. Left arm pain yesterday when pain began, no left arm pain today. ekg obtained and given to provider. Labs drawn and sent. Cardiac monitor showing sinus brady 55. Vss. Call light w/i reach. Safety maintained. Awaiting orders to address patient chest pain by md.

## 2019-02-27 NOTE — ED Provider Notes (Signed)
[facility] Emergency Department Provider Note   ____________________________________________   First MD Initiated Contact with Patient 02/27/19 1312     (approximate)  I have reviewed the triage vital signs and the nursing notes.   HISTORY  Chief Complaint Diarrhea and Chest Pain    HPI Jason Duncan is a 53 y.o. male presents for evaluation for abdominal discomfort and diarrhea  also experiencing couple days of discomfort over the left side of the left lower chest  Patient ports 2 weeks ago he ate a fish sandwich at [facility], he reports that 30 minutes later he started having severe vomiting and diarrhea that lasted for a day.  That seem to slowly get improved but he is continued to have loose stools a couple times daily that are nonbloody.  He has had off-and-on abdominal discomfort and crampy discomfort.  Some nausea and decreased appetite but still able to eat and drink fluids.  Reports stomach continues to feel sore especially over the left side.  Additionally, the last 2 days he has had times where he felt a little bit lightheaded also having a hard to describe but very mild discomfort sort of under his left rib cage that does not radiate.  No pain in the back neck or jaw.  No right-sided pain.  Does have a strong family history of heart disease including his brother and father.  His chest pain does not worsen with exertion.  Does not radiate.  He is felt a little sweaty at times but not certain is related to the chest discomfort.  Some nausea but relates that to his stomach just not fully getting better for the last 2 weeks.  He has no personal history of heart disease.  Denies history of hypertension high cholesterol or coronary disease..  Non-smoker.  Past Medical History:  Diagnosis Date   Erectile dysfunction    Erectile dysfunction 06/24/2017   Kidney stones     Patient Active Problem List   Diagnosis Date Noted   Preventative health  care 06/24/2017   Colon cancer screening 06/24/2017   Erectile dysfunction 06/24/2017   Vitamin D deficiency 06/24/2017    Past Surgical History:  Procedure Laterality Date   EXTERNAL EAR SURGERY Bilateral 1973   LEG SURGERY Right 1988   rods placed due to broken leg   SHOULDER SURGERY Right 2003    Prior to Admission medications   Medication Sig Start Date End Date Taking? Authorizing Provider  ondansetron (ZOFRAN ODT) 4 MG disintegrating tablet Take 1 tablet (4 mg total) by mouth every 6 (six) hours as needed for nausea or vomiting. 02/27/19   Delman Kitten, MD  sildenafil (REVATIO) 20 MG tablet TAKE ONE TO THREE TABLETS BY MOUTH DAILY AS NEEDED 02/02/19   Poulose, Bethel Born, NP    Allergies Sulfur  Family History  Problem Relation Age of Onset   Hypertension Mother    Pneumonia Maternal Grandmother    Cancer Maternal Grandfather        lung   Heart disease Brother        28 years old, died from a heart attack    Social History Social History   Tobacco Use   Smoking status: Never Smoker   Smokeless tobacco: Current User    Types: Chew  Substance Use Topics   Alcohol use: No   Drug use: No    Comment: used to smoke pot from [age] y old     Review of Systems Constitutional: No  fever/chills, fatigue Eyes: No visual changes. ENT: No sore throat. Cardiovascular: See HPI Respiratory: Denies shortness of breath. Gastrointestinal: The HPI Genitourinary: Negative for dysuria. Musculoskeletal: Negative for back pain. Skin: Negative for rash. Neurological: Negative for headaches, areas of focal weakness or numbness.    ____________________________________________   PHYSICAL EXAM:  VITAL SIGNS: ED Triage Vitals  Enc Vitals Group     BP 02/27/19 1214 (!) 154/94     Pulse Rate 02/27/19 1214 61     Resp 02/27/19 1214 16     Temp 02/27/19 1214 98.7 F (37.1 C)     Temp Source 02/27/19 1214 Oral     SpO2 02/27/19 1214 100 %     Weight 02/27/19  1211 185 lb (83.9 kg)     Height 02/27/19 1211 5\' 8"  (1.727 m)     Head Circumference --      Peak Flow --      Pain Score 02/27/19 1211 4     Pain Loc --      Pain Edu? --      Excl. in [place]? --     Constitutional: Alert and oriented. Well appearing and in no acute distress.  He is very pleasant as well as his sister who accompanies. Eyes: Conjunctivae are normal. Head: Atraumatic. Nose: No congestion/rhinnorhea. Mouth/Throat: Mucous membranes are moist. Neck: No stridor.  Cardiovascular: Normal rate, regular rhythm. Grossly normal heart sounds.  Good peripheral circulation. Respiratory: Normal respiratory effort.  No retractions. Lungs CTAB. Gastrointestinal: Soft and there is mild tenderness in the left upper quadrant, and palpation in the right lower quadrant and right sided abdomen causes pain in the left upper quadrant. No distention. Musculoskeletal: No lower extremity tenderness nor edema. Neurologic:  Normal speech and language. No gross focal neurologic deficits are appreciated.  Skin:  Skin is warm, dry and intact. No rash noted. Psychiatric: Mood and affect are normal. Speech and behavior are normal.  Patient relates that he had a COVID test sent by his work yesterday that has not yet resulted as he had a exposure he was at church where his pastor was diagnosed with COVID but he reports he was not around that particular person.  He is not having any runny nose, fever, chills, cough or loss of taste. ____________________________________________   LABS (all labs ordered are listed, but only abnormal results are displayed)  Labs Reviewed  BASIC METABOLIC PANEL - Abnormal; Notable for the following components:      Result Value   Glucose, Bld 103 (*)    All other components within normal limits  HEPATIC FUNCTION PANEL - Abnormal; Notable for the following components:   Total Protein 8.3 (*)    Albumin 5.4 (*)    All other components within normal limits  GASTROINTESTINAL  PANEL BY PCR, STOOL (REPLACES STOOL CULTURE)  C DIFFICILE QUICK SCREEN W PCR REFLEX  CBC  TROPONIN I  PROTIME-INR  LIPASE, BLOOD  TROPONIN I   ____________________________________________  EKG  See separate note, normal EKG ____________________________________________  RADIOLOGY  Ct Abdomen Pelvis W Contrast  Result Date: 02/27/2019 CLINICAL DATA:  Abdominal pain, diarrhea, nausea EXAM: CT ABDOMEN AND PELVIS WITH CONTRAST TECHNIQUE: Multidetector CT imaging of the abdomen and pelvis was performed using the standard protocol following bolus administration of intravenous contrast. CONTRAST:  174mL OMNIPAQUE IOHEXOL 300 MG/ML  SOLN COMPARISON:  09/26/2004 FINDINGS: Lower chest: Normal heart size. No pericardial or pleural effusion. Small hiatal hernia. Residual contrast in the lower esophagus suggests reflux. Hepatobiliary: No  focal liver abnormality is seen. No gallstones, gallbladder wall thickening, or biliary dilatation. Pancreas: Unremarkable. No pancreatic ductal dilatation or surrounding inflammatory changes. Spleen: Normal in size without focal abnormality. Adrenals/Urinary Tract: Normal adrenal glands. Hypodense upper pole left renal cyst measures 10 mm. No renal obstruction or hydronephrosis. No hydroureter or ureteral calculus on either side. Urinary bladder unremarkable. Stomach/Bowel: Negative for bowel obstruction, significant dilatation, ileus, or free air. Normal retrocecal appendix. No acute inflammatory process or bowel wall thickening. No fluid collection, hemorrhage, hematoma, abscess or ascites. Vascular/Lymphatic: Mesenteric and renal vasculature appear patent. No aortoiliac disease. Negative for aneurysm. No bulky adenopathy. Reproductive: Prostate calcifications noted. Symmetric seminal vesicles. No acute finding by CT Other: No inguinal abnormality. Small fat containing umbilical hernia. No large ventral hernia. No free fluid or ascites Musculoskeletal: Degenerative changes  of the spine. IMPRESSION: No acute intra-abdominopelvic finding. Small hiatal hernia with oral contrast in the lower esophagus may represent reflux. Electronically Signed   By: Jerilynn Mages.  Shick M.D.   On: 02/27/2019 14:47   Dg Chest Port 1 View  Result Date: 02/27/2019 CLINICAL DATA:  Here for diarrhea X 2 days and left chest tightness. Had NVD last week after eating fish sandwich. Also reports that pulled tick off his right leg the other day. No fever. Non-smoker. EXAM: PORTABLE CHEST 1 VIEW COMPARISON:  09/25/2010 FINDINGS: Stable widening of the right AC joint, suspect distal clavicular resection. Skeleton otherwise unremarkable. The lungs appear clear.  Cardiac and mediastinal contours normal. No pleural effusion identified. IMPRESSION: 1.  No active cardiopulmonary disease is radiographically apparent. 2. Prior right distal clavicular resection. Electronically Signed   By: Van Clines M.D.   On: 02/27/2019 13:36    CT reviewed, no acute finding a small hiatal hernia is noted. ____________________________________________   PROCEDURES  Procedure(s) performed: None  Procedures  Critical Care performed: No  ____________________________________________   INITIAL IMPRESSION / ASSESSMENT AND PLAN / ED COURSE  Pertinent labs & imaging results that were available during my care of the patient were reviewed by me and considered in my medical decision making (see chart for details).   Patient presents for evaluation having some left-sided fairly well localized left lower rib or chest discomfort but associated with also left upper quadrant abdominal pain in the setting of recent nausea vomiting diarrhea that seem to be slowly improving.  He does have some tenderness on exam in the left upper quadrant also some referred pain to palpation in the right lower quadrant causing pain in the left upper quadrant sparking me to question if there could be some very mild peritonitis in the region.  His EKG is  completely normal, his symptoms do not seem classic for acute coronary syndrome though he does have risk factors.  We will send a troponin and due to the associated discomfort in the abdomen obtain a CT to further evaluate as I suspect there is some type of process in the left upper quadrant referring discomfort to the left rib region and findings unlikely to represent ACS.  Patient agreeable with plan, antiemetics, hydration.  Reports pain is very minimal at this time.    ----------------------------------------- 3:22 PM on 02/27/2019 -----------------------------------------  Patient reports he is feeling better.  He would like to be able to be discharged now, but I discussed with him and we will send a second troponin now as he is willing to wait for this if we can set it at this point.  I doubt his chest pain is related to  cardiac disease at this time.  He reports that he is noticed when he lays down his pain goes away and when he sits up as if he is doing a bit of an abdominal crunch the pain around the left ribs left upper abdomen comes back.  He is resting quite comfortably, ongoing care signed Dr. Kerman Passey, follow-up on second troponin if this is normal I would plan to discharge the patient and he is agreeable to follow-up with his primary care doctor.  Also discussed finding of hiatal hernia and careful abdominal and chest pain return precautions with the patient is in agreement to.  Jason Duncan was evaluated in Emergency Department on 02/27/2019 for the symptoms described in the history of present illness. He was evaluated in the context of the global COVID-19 pandemic, which necessitated consideration that the patient might be at risk for infection with the SARS-CoV-2 virus that causes COVID-19. Institutional protocols and algorithms that pertain to the evaluation of patients at risk for COVID-19 are in a state of rapid change based on information released by regulatory bodies including  the CDC and federal and state organizations. These policies and algorithms were followed during the patient's care in the ED.   ____________________________________________   FINAL CLINICAL IMPRESSION(S) / ED DIAGNOSES  Final diagnoses:  Chest pain, atypical  Intermittent left upper quadrant abdominal pain  Hiatal hernia        Note:  This document was prepared using Dragon voice recognition software and may include unintentional dictation errors       Delman Kitten, MD 02/27/19 1524

## 2019-02-27 NOTE — ED Notes (Signed)
Md at bedside to re-eval. Second trop drawn and sent.

## 2019-03-02 ENCOUNTER — Encounter: Payer: Self-pay | Admitting: Nurse Practitioner

## 2019-03-02 ENCOUNTER — Ambulatory Visit (INDEPENDENT_AMBULATORY_CARE_PROVIDER_SITE_OTHER): Payer: Commercial Managed Care - PPO | Admitting: Nurse Practitioner

## 2019-03-02 ENCOUNTER — Other Ambulatory Visit: Payer: Self-pay

## 2019-03-02 VITALS — BP 116/87 | HR 80 | Temp 98.5°F

## 2019-03-02 DIAGNOSIS — R197 Diarrhea, unspecified: Secondary | ICD-10-CM | POA: Diagnosis not present

## 2019-03-02 DIAGNOSIS — R079 Chest pain, unspecified: Secondary | ICD-10-CM

## 2019-03-02 DIAGNOSIS — R030 Elevated blood-pressure reading, without diagnosis of hypertension: Secondary | ICD-10-CM

## 2019-03-02 DIAGNOSIS — K449 Diaphragmatic hernia without obstruction or gangrene: Secondary | ICD-10-CM

## 2019-03-02 DIAGNOSIS — K219 Gastro-esophageal reflux disease without esophagitis: Secondary | ICD-10-CM | POA: Insufficient documentation

## 2019-03-02 DIAGNOSIS — R1084 Generalized abdominal pain: Secondary | ICD-10-CM

## 2019-03-02 MED ORDER — PANTOPRAZOLE SODIUM 40 MG PO TBEC: 40.0000 mg | DELAYED_RELEASE_TABLET | Freq: Every day | ORAL | 0 refills | Status: DC | PRN

## 2019-03-02 NOTE — Progress Notes (Signed)
Virtual Visit via Video Note  I connected with Jason Duncan on 03/02/19 at  8:20 AM EDT by a video enabled telemedicine application and verified that I am speaking with the correct person using two identifiers.   Staff discussed the limitations of evaluation and management by telemedicine and the availability of in person appointments. The patient expressed understanding and agreed to proceed.  Patient location: home  My location: work office Other people present: none HPI  Patient was seen at [facility] ED on 02/27/2019 presenting with abdominal discomfort and diarrhea x 3 days and left sided chest pain. Chest pain was described as left mild discomfort under left rib without radiation but accompanied with some mild lightheadedness. No  Personal history of heart disease but has strong family history. EKG- normal sinus rhythm, 2 negative troponin's, electrolyte, blood counts, liver and kidney function WNL. Chest xray clear of acute findings.   Patient states he is feeling much better now, diarrhea has resolved and he has more appetite starting yesterday. Patient endorses mild abdominal discomfort that resolves the chest pain. Abdominal discomfort was associated with diarrhea.  He has been monitoring his blood pressures at home since then.  Saturday when he went to the hospital his blood pressure was 185/122 146/97 is the highest since then; 116/87 is the lowest he has had since then.   Negative COVID19 testing.   PHQ2/9: Depression screen [facility] 2/9 03/02/2019 08/10/2018 07/09/2018 06/24/2017  Decreased Interest 0 0 0 0  Down, Depressed, Hopeless 0 0 0 2  PHQ - 2 Score 0 0 0 2  Altered sleeping 0 0 - 3  Tired, decreased energy 0 0 - 0  Change in appetite 0 0 - 0  Feeling bad or failure about yourself  0 0 - 1  Trouble concentrating 0 0 - 0  Moving slowly or fidgety/restless 0 0 - 0  Suicidal thoughts 0 0 - 1  PHQ-9 Score 0 0 - 7  Difficult doing work/chores Not difficult at all Not difficult at  all - Somewhat difficult     PHQ reviewed. Negative  Patient Active Problem List   Diagnosis Date Noted  . Hiatal hernia with GERD 03/02/2019  . Preventative health care 06/24/2017  . Colon cancer screening 06/24/2017  . Erectile dysfunction 06/24/2017  . Vitamin D deficiency 06/24/2017    Past Medical History:  Diagnosis Date  . Erectile dysfunction   . Erectile dysfunction 06/24/2017  . Kidney stones     Past Surgical History:  Procedure Laterality Date  . EXTERNAL EAR SURGERY Bilateral 1973  . LEG SURGERY Right 1988   rods placed due to broken leg  . SHOULDER SURGERY Right 2003    Social History   Tobacco Use  . Smoking status: Never Smoker  . Smokeless tobacco: Current User    Types: Chew  Substance Use Topics  . Alcohol use: No     Current Outpatient Medications:  .  sildenafil (REVATIO) 20 MG tablet, TAKE ONE TO THREE TABLETS BY MOUTH DAILY AS NEEDED, Disp: 30 tablet, Rfl: 0 .  pantoprazole (PROTONIX) 40 MG tablet, Take 1 tablet (40 mg total) by mouth daily as needed (acid reflux)., Disp: 90 tablet, Rfl: 0  Allergies  Allergen Reactions  . Sulfur     ROS   No other specific complaints in a complete review of systems (except as listed in HPI above).  Objective  Vitals:   03/02/19 0757 03/02/19 0846  BP: (!) 125/97 116/87  Pulse: 80   Temp:  98.5 F (36.9 C)   TempSrc: Oral      There is no height or weight on file to calculate BMI.  Nursing Note and Vital Signs reviewed.  Physical Exam  Constitutional: Patient appears well-developed and well-nourished. No distress.  HENT: Head: Normocephalic and atraumatic. Cardiovascular: Normal rate Pulmonary/Chest: Effort normal  Musculoskeletal: Normal range of motion,  Neurological: alert and oriented, speech normal.  Skin: No rash noted. No erythema.  Psychiatric: Patient has a normal mood and affect. behavior is normal. Judgment and thought content normal.    Assessment & Plan  1.  Left-sided chest pain Improved   2. Generalized abdominal pain resolved  3. Diarrhea of presumed infectious origin resolved  4. Hiatal hernia with GERD - pantoprazole (PROTONIX) 40 MG tablet; Take 1 tablet (40 mg total) by mouth daily as needed (acid reflux).  Dispense: 90 tablet; Refill: 0  5. Elevated blood pressure reading Discussed DASH guidelines, as blood pressure appears to going back down ill monitor for 2 weeks and discuss.    Follow Up Instructions:   2 weeks  I discussed the assessment and treatment plan with the patient. The patient was provided an opportunity to ask questions and all were answered. The patient agreed with the plan and demonstrated an understanding of the instructions.   The patient was advised to call back or seek an in-person evaluation if the symptoms worsen or if the condition fails to improve as anticipated.  I provided 23 minutes of non-face-to-face time during this encounter.   Fredderick Severance, NP

## 2019-03-02 NOTE — Patient Instructions (Signed)
DASH Eating Plan  DASH stands for "Dietary Approaches to Stop Hypertension." The DASH eating plan is a healthy eating plan that has been shown to reduce high blood pressure (hypertension). It may also reduce your risk for type 2 diabetes, heart disease, and stroke. The DASH eating plan may also help with weight loss.  What are tips for following this plan?    General guidelines   Avoid eating more than 2,300 mg (milligrams) of salt (sodium) a day. If you have hypertension, you may need to reduce your sodium intake to 1,500 mg a day.   Limit alcohol intake to no more than 1 drink a day for nonpregnant women and 2 drinks a day for men. One drink equals 12 oz of beer, 5 oz of wine, or 1 oz of hard liquor.   Work with your health care provider to maintain a healthy body weight or to lose weight. Ask what an ideal weight is for you.   Get at least 30 minutes of exercise that causes your heart to beat faster (aerobic exercise) most days of the week. Activities may include walking, swimming, or biking.   Work with your health care provider or diet and nutrition specialist (dietitian) to adjust your eating plan to your individual calorie needs.  Reading food labels     Check food labels for the amount of sodium per serving. Choose foods with less than 5 percent of the Daily Value of sodium. Generally, foods with less than 300 mg of sodium per serving fit into this eating plan.   To find whole grains, look for the word "whole" as the first word in the ingredient list.  Shopping   Buy products labeled as "low-sodium" or "no salt added."   Buy fresh foods. Avoid canned foods and premade or frozen meals.  Cooking   Avoid adding salt when cooking. Use salt-free seasonings or herbs instead of table salt or sea salt. Check with your health care provider or pharmacist before using salt substitutes.   Do not fry foods. Cook foods using healthy methods such as baking, boiling, grilling, and broiling instead.   Cook with  heart-healthy oils, such as olive, canola, soybean, or sunflower oil.  Meal planning   Eat a balanced diet that includes:  ? 5 or more servings of fruits and vegetables each day. At each meal, try to fill half of your plate with fruits and vegetables.  ? Up to 6-8 servings of whole grains each day.  ? Less than 6 oz of lean meat, poultry, or fish each day. A 3-oz serving of meat is about the same size as a deck of cards. One egg equals 1 oz.  ? 2 servings of low-fat dairy each day.  ? A serving of nuts, seeds, or beans 5 times each week.  ? Heart-healthy fats. Healthy fats called Omega-3 fatty acids are found in foods such as flaxseeds and coldwater fish, like sardines, salmon, and mackerel.   Limit how much you eat of the following:  ? Canned or prepackaged foods.  ? Food that is high in trans fat, such as fried foods.  ? Food that is high in saturated fat, such as fatty meat.  ? Sweets, desserts, sugary drinks, and other foods with added sugar.  ? Full-fat dairy products.   Do not salt foods before eating.   Try to eat at least 2 vegetarian meals each week.   Eat more home-cooked food and less restaurant, buffet, and fast food.     When eating at a restaurant, ask that your food be prepared with less salt or no salt, if possible.  What foods are recommended?  The items listed may not be a complete list. Talk with your dietitian about what dietary choices are best for you.  Grains  Whole-grain or whole-wheat bread. Whole-grain or whole-wheat pasta. Brown rice. Oatmeal. Quinoa. Bulgur. Whole-grain and low-sodium cereals. Pita bread. Low-fat, low-sodium crackers. Whole-wheat flour tortillas.  Vegetables  Fresh or frozen vegetables (raw, steamed, roasted, or grilled). Low-sodium or reduced-sodium tomato and vegetable juice. Low-sodium or reduced-sodium tomato sauce and tomato paste. Low-sodium or reduced-sodium canned vegetables.  Fruits  All fresh, dried, or frozen fruit. Canned fruit in natural juice (without  added sugar).  Meat and other protein foods  Skinless chicken or turkey. Ground chicken or turkey. Pork with fat trimmed off. Fish and seafood. Egg whites. Dried beans, peas, or lentils. Unsalted nuts, nut butters, and seeds. Unsalted canned beans. Lean cuts of beef with fat trimmed off. Low-sodium, lean deli meat.  Dairy  Low-fat (1%) or fat-free (skim) milk. Fat-free, low-fat, or reduced-fat cheeses. Nonfat, low-sodium ricotta or cottage cheese. Low-fat or nonfat yogurt. Low-fat, low-sodium cheese.  Fats and oils  Soft margarine without trans fats. Vegetable oil. Low-fat, reduced-fat, or light mayonnaise and salad dressings (reduced-sodium). Canola, safflower, olive, soybean, and sunflower oils. Avocado.  Seasoning and other foods  Herbs. Spices. Seasoning mixes without salt. Unsalted popcorn and pretzels. Fat-free sweets.  What foods are not recommended?  The items listed may not be a complete list. Talk with your dietitian about what dietary choices are best for you.  Grains  Baked goods made with fat, such as croissants, muffins, or some breads. Dry pasta or rice meal packs.  Vegetables  Creamed or fried vegetables. Vegetables in a cheese sauce. Regular canned vegetables (not low-sodium or reduced-sodium). Regular canned tomato sauce and paste (not low-sodium or reduced-sodium). Regular tomato and vegetable juice (not low-sodium or reduced-sodium). Pickles. Olives.  Fruits  Canned fruit in a light or heavy syrup. Fried fruit. Fruit in cream or butter sauce.  Meat and other protein foods  Fatty cuts of meat. Ribs. Fried meat. Bacon. Sausage. Bologna and other processed lunch meats. Salami. Fatback. Hotdogs. Bratwurst. Salted nuts and seeds. Canned beans with added salt. Canned or smoked fish. Whole eggs or egg yolks. Chicken or turkey with skin.  Dairy  Whole or 2% milk, cream, and half-and-half. Whole or full-fat cream cheese. Whole-fat or sweetened yogurt. Full-fat cheese. Nondairy creamers. Whipped toppings.  Processed cheese and cheese spreads.  Fats and oils  Butter. Stick margarine. Lard. Shortening. Ghee. Bacon fat. Tropical oils, such as coconut, palm kernel, or palm oil.  Seasoning and other foods  Salted popcorn and pretzels. Onion salt, garlic salt, seasoned salt, table salt, and sea salt. Worcestershire sauce. Tartar sauce. Barbecue sauce. Teriyaki sauce. Soy sauce, including reduced-sodium. Steak sauce. Canned and packaged gravies. Fish sauce. Oyster sauce. Cocktail sauce. Horseradish that you find on the shelf. Ketchup. Mustard. Meat flavorings and tenderizers. Bouillon cubes. Hot sauce and Tabasco sauce. Premade or packaged marinades. Premade or packaged taco seasonings. Relishes. Regular salad dressings.  Where to find more information:   National Heart, Lung, and Blood Institute: www.nhlbi.nih.gov   American Heart Association: www.heart.org  Summary   The DASH eating plan is a healthy eating plan that has been shown to reduce high blood pressure (hypertension). It may also reduce your risk for type 2 diabetes, heart disease, and stroke.   With the   DASH eating plan, you should limit salt (sodium) intake to 2,300 mg a day. If you have hypertension, you may need to reduce your sodium intake to 1,500 mg a day.   When on the DASH eating plan, aim to eat more fresh fruits and vegetables, whole grains, lean proteins, low-fat dairy, and heart-healthy fats.   Work with your health care provider or diet and nutrition specialist (dietitian) to adjust your eating plan to your individual calorie needs.  This information is not intended to replace advice given to you by your health care provider. Make sure you discuss any questions you have with your health care provider.  Document Released: 08/22/2011 Document Revised: 08/26/2016 Document Reviewed: 08/26/2016  Elsevier Interactive Patient Education  2019 Elsevier Inc.

## 2019-03-15 NOTE — Progress Notes (Signed)
Virtual Visit via Video Note  I connected with Jason Duncan on 03/16/19 at  8:40 AM EDT by a video enabled telemedicine application and verified that I am speaking with the correct person using two identifiers.    Staff discussed the limitations of evaluation and management by telemedicine and the availability of in person appointments. The patient expressed understanding and agreed to proceed.  Patient location: home  My location: work office Other people present:  none HPI  Patient endorses sinus pressure started yesterday afternoon- right side of face has tried tylenol, BC powder and Claritin. Getting copious amounts of thick yellow drainage,using saline nasal spray. Denies fever, chills, cough, shortness of breath.   Elevated blood pressure Patient presents for 2 week follow-up on elevated blood pressures. He was seen in ED at the beginning of this month for chest pain- ruled out ACS. Saturday when he went to the hospital his blood pressure was 185/122. Prior to last visit it was ranging between 116-146/87-97. Discussed DASH guidelines and patient presents for recheck. States this past week 104-130/69-86. He has cut out a lot of salt, drink a lot of water.   GERD Patient was started on protonix 2 weeks ago, states he forgot to pick it up is taking rolaids with some relief of symptoms.   PHQ2/9: Depression screen [facility] 2/9 03/16/2019 03/02/2019 08/10/2018 07/09/2018 06/24/2017  Decreased Interest 0 0 0 0 0  Down, Depressed, Hopeless 0 0 0 0 2  PHQ - 2 Score 0 0 0 0 2  Altered sleeping 0 0 0 - 3  Tired, decreased energy 0 0 0 - 0  Change in appetite 0 0 0 - 0  Feeling bad or failure about yourself  0 0 0 - 1  Trouble concentrating 0 0 0 - 0  Moving slowly or fidgety/restless 0 0 0 - 0  Suicidal thoughts 0 0 0 - 1  PHQ-9 Score 0 0 0 - 7  Difficult doing work/chores Not difficult at all Not difficult at all Not difficult at all - Somewhat difficult     PHQ reviewed.  Negative  Patient Active Problem List   Diagnosis Date Noted  . Hiatal hernia with GERD 03/02/2019  . Erectile dysfunction 06/24/2017  . Vitamin D deficiency 06/24/2017    Past Medical History:  Diagnosis Date  . Erectile dysfunction   . Erectile dysfunction 06/24/2017  . Kidney stones     Past Surgical History:  Procedure Laterality Date  . EXTERNAL EAR SURGERY Bilateral 1973  . LEG SURGERY Right 1988   rods placed due to broken leg  . SHOULDER SURGERY Right 2003    Social History   Tobacco Use  . Smoking status: Never Smoker  . Smokeless tobacco: Current User    Types: Chew  Substance Use Topics  . Alcohol use: No     Current Outpatient Medications:  .  sildenafil (REVATIO) 20 MG tablet, TAKE ONE TO THREE TABLETS BY MOUTH DAILY AS NEEDED, Disp: 30 tablet, Rfl: 0 .  pantoprazole (PROTONIX) 40 MG tablet, Take 1 tablet (40 mg total) by mouth daily as needed (acid reflux). (Patient not taking: Reported on 03/16/2019), Disp: 90 tablet, Rfl: 0  Allergies  Allergen Reactions  . Sulfur     ROS   No other specific complaints in a complete review of systems (except as listed in HPI above).  Objective  Vitals:   03/16/19 0830  BP: 130/84  Temp: (!) 97.5 F (36.4 C)     There is  no height or weight on file to calculate BMI.  Nursing Note and Vital Signs reviewed.  Physical Exam   Constitutional: Patient appears well-developed and well-nourished. No distress.  HENT: Head: Normocephalic and atraumatic. Pulmonary/Chest: Effort normal  Neurological: alert and oriented, speech normal.  Skin: No rash noted. No erythema.  Psychiatric: Patient has a normal mood and affect. behavior is normal. Judgment and thought content normal.    Assessment & Plan  1. Elevated blood pressure reading Improved, continue with dash   2. Hiatal hernia with GERD Start PPI PRN  3. Acute non-recurrent maxillary sinusitis Discussed OTC relief and ROC symptoms.  - fluticasone  (FLONASE) 50 MCG/ACT nasal spray; Place 2 sprays into both nostrils daily.  Dispense: 16 g; Refill: 6   Follow Up Instructions:    I discussed the assessment and treatment plan with the patient. The patient was provided an opportunity to ask questions and all were answered. The patient agreed with the plan and demonstrated an understanding of the instructions.   The patient was advised to call back or seek an in-person evaluation if the symptoms worsen or if the condition fails to improve as anticipated.  I provided 12 minutes of non-face-to-face time during this encounter.   Fredderick Severance, NP

## 2019-03-16 ENCOUNTER — Ambulatory Visit (INDEPENDENT_AMBULATORY_CARE_PROVIDER_SITE_OTHER): Payer: Commercial Managed Care - PPO | Admitting: Nurse Practitioner

## 2019-03-16 ENCOUNTER — Encounter: Payer: Self-pay | Admitting: Nurse Practitioner

## 2019-03-16 VITALS — BP 130/84 | Temp 97.5°F

## 2019-03-16 DIAGNOSIS — K449 Diaphragmatic hernia without obstruction or gangrene: Secondary | ICD-10-CM | POA: Diagnosis not present

## 2019-03-16 DIAGNOSIS — K219 Gastro-esophageal reflux disease without esophagitis: Secondary | ICD-10-CM

## 2019-03-16 DIAGNOSIS — J0100 Acute maxillary sinusitis, unspecified: Secondary | ICD-10-CM

## 2019-03-16 DIAGNOSIS — R030 Elevated blood-pressure reading, without diagnosis of hypertension: Secondary | ICD-10-CM | POA: Diagnosis not present

## 2019-03-16 MED ORDER — FLUTICASONE PROPIONATE 50 MCG/ACT NA SUSP: 2.0000 | Freq: Every day | NASAL | 6 refills | Status: DC

## 2019-05-15 ENCOUNTER — Emergency Department: Payer: Commercial Managed Care - PPO

## 2019-05-15 ENCOUNTER — Other Ambulatory Visit: Payer: Self-pay

## 2019-05-15 ENCOUNTER — Encounter: Payer: Self-pay | Admitting: Emergency Medicine

## 2019-05-15 ENCOUNTER — Emergency Department
Admission: EM | Admit: 2019-05-15 | Discharge: 2019-05-15 | Disposition: A | Discharge status: Home / Self Care | Payer: Commercial Managed Care - PPO | Attending: Emergency Medicine | Admitting: Emergency Medicine

## 2019-05-15 DIAGNOSIS — Y939 Activity, unspecified: Secondary | ICD-10-CM | POA: Diagnosis not present

## 2019-05-15 DIAGNOSIS — Y999 Unspecified external cause status: Secondary | ICD-10-CM | POA: Diagnosis not present

## 2019-05-15 DIAGNOSIS — S93401A Sprain of unspecified ligament of right ankle, initial encounter: Secondary | ICD-10-CM

## 2019-05-15 DIAGNOSIS — F17220 Nicotine dependence, chewing tobacco, uncomplicated: Secondary | ICD-10-CM | POA: Insufficient documentation

## 2019-05-15 DIAGNOSIS — Y929 Unspecified place or not applicable: Secondary | ICD-10-CM | POA: Insufficient documentation

## 2019-05-15 DIAGNOSIS — S99911A Unspecified injury of right ankle, initial encounter: Secondary | ICD-10-CM | POA: Diagnosis present

## 2019-05-15 DIAGNOSIS — X501XXA Overexertion from prolonged static or awkward postures, initial encounter: Secondary | ICD-10-CM | POA: Diagnosis not present

## 2019-05-15 DIAGNOSIS — Z79899 Other long term (current) drug therapy: Secondary | ICD-10-CM | POA: Diagnosis not present

## 2019-05-15 NOTE — ED Notes (Signed)
Pt states he rolled ankle yesterday morning. Pain has been increasing. Right ankle swollen. Pt states he has broken right ankle 5 times before.

## 2019-05-15 NOTE — ED Provider Notes (Signed)
[facility] Emergency Department Provider Note  ____________________________________________   None    (approximate)  I have reviewed the triage vital signs and the nursing notes.   HISTORY  Chief Complaint Ankle Pain    HPI Jason Duncan is a 53 y.o. male presents emergency department complaining of right ankle pain.  States he rolled his ankle yesterday.  Says it has been swollen and painful to bear weight.  No numbness or tingling. Taking otc ibuprofen   Past Medical History:  Diagnosis Date  . Erectile dysfunction   . Erectile dysfunction 06/24/2017  . Kidney stones     Patient Active Problem List   Diagnosis Date Noted  . Hiatal hernia with GERD 03/02/2019  . Erectile dysfunction 06/24/2017  . Vitamin D deficiency 06/24/2017    Past Surgical History:  Procedure Laterality Date  . EXTERNAL EAR SURGERY Bilateral 1973  . LEG SURGERY Right 1988   rods placed due to broken leg  . SHOULDER SURGERY Right 2003    Prior to Admission medications   Medication Sig Start Date End Date Taking? Authorizing Provider  fluticasone (FLONASE) 50 MCG/ACT nasal spray Place 2 sprays into both nostrils daily. 03/16/19   Poulose, Bethel Born, NP  pantoprazole (PROTONIX) 40 MG tablet Take 1 tablet (40 mg total) by mouth daily as needed (acid reflux). Patient not taking: Reported on 03/16/2019 03/02/19   Fredderick Severance, NP  sildenafil (REVATIO) 20 MG tablet TAKE ONE TO THREE TABLETS BY MOUTH DAILY AS NEEDED 02/02/19   Poulose, Bethel Born, NP    Allergies Sulfur  Family History  Problem Relation Age of Onset  . Hypertension Mother   . Pneumonia Maternal Grandmother   . Cancer Maternal Grandfather        lung  . Heart disease Brother        10 years old, died from a heart attack    Social History Social History   Tobacco Use  . Smoking status: Never Smoker  . Smokeless tobacco: Current User    Types: Chew  Substance Use Topics  . Alcohol  use: No  . Drug use: No    Comment: used to smoke pot from [age] y old     Review of Systems  Constitutional: No fever/chills Eyes: No visual changes. ENT: No sore throat. Respiratory: Denies cough Genitourinary: Negative for dysuria. Musculoskeletal: Negative for back pain. Right ankle pain Skin: Negative for rash.    ____________________________________________   PHYSICAL EXAM:  VITAL SIGNS: ED Triage Vitals  Enc Vitals Group     BP 05/15/19 1123 116/72     Pulse Rate 05/15/19 1123 (!) 56     Resp 05/15/19 1123 18     Temp 05/15/19 1123 98.3 F (36.8 C)     Temp Source 05/15/19 1123 Oral     SpO2 05/15/19 1123 98 %     Weight 05/15/19 1128 186 lb (84.4 kg)     Height 05/15/19 1128 5\' 8"  (1.727 m)     Head Circumference --      Peak Flow --      Pain Score 05/15/19 1128 6     Pain Loc --      Pain Edu? --      Excl. in [place]? --     Constitutional: Alert and oriented. Well appearing and in no acute distress. Eyes: Conjunctivae are normal.  Head: Atraumatic. Nose: No congestion/rhinnorhea. Mouth/Throat: Mucous membranes are moist.   Neck:  supple no lymphadenopathy  noted Cardiovascular: Normal rate, regular rhythm. Respiratory: Normal respiratory effort.  No retractions,  GU: deferred Musculoskeletal:Right ankle has swelling at lateral aspect, tender to palp, decreased rom secondary to discomfort , n/v intact Neurologic:  Normal speech and language.  Skin:  Skin is warm, dry and intact. No rash noted. Psychiatric: Mood and affect are normal. Speech and behavior are normal.  ____________________________________________   LABS (all labs ordered are listed, but only abnormal results are displayed)  Labs Reviewed - No data to display ____________________________________________   ____________________________________________  RADIOLOGY  X-ray of the right ankle is negative for fracture  ____________________________________________   PROCEDURES   Procedure(s) performed: Ace wrap, stirrup splint, crutches   Procedures    ____________________________________________   INITIAL IMPRESSION / ASSESSMENT AND PLAN / ED COURSE  Pertinent labs & imaging results that were available during my care of the patient were reviewed by me and considered in my medical decision making (see chart for details).   Patient is 53 year old male presents emergency department complaining of right ankle pain after twisting it yesterday.  Physical exam shows right ankle to be tender and swollen lateral malleolus.  Neurovascular is intact.  Full range of motion is limited to discomfort.  X-ray of the right ankle is negative  Explained the x-ray results to the patient.  He states he can take over-the-counter ibuprofen for pain but he does need a work note as he works as a Theatre manager person.  He was placed in a Ace wrap, stirrup splint, and given crutches.  He was given a work note stating light duty for 2 weeks.  He was discharged in stable condition.    Jason Duncan was evaluated in Emergency Department on 05/15/2019 for the symptoms described in the history of present illness. He was evaluated in the context of the global COVID-19 pandemic, which necessitated consideration that the patient might be at risk for infection with the SARS-CoV-2 virus that causes COVID-19. Institutional protocols and algorithms that pertain to the evaluation of patients at risk for COVID-19 are in a state of rapid change based on information released by regulatory bodies including the CDC and federal and state organizations. These policies and algorithms were followed during the patient's care in the ED.   As part of my medical decision making, I reviewed the following data within the [facility] notes reviewed and incorporated, Old chart reviewed, Radiograph reviewed x-ray of the right ankle is negative, Notes from prior ED visits and [place] Controlled  Substance Database  ____________________________________________   FINAL CLINICAL IMPRESSION(S) / ED DIAGNOSES  Final diagnoses:  Sprain of right ankle, unspecified ligament, initial encounter      NEW MEDICATIONS STARTED DURING THIS VISIT:  New Prescriptions   No medications on file     Note:  This document was prepared using Dragon voice recognition software and may include unintentional dictation errors.    Versie Starks, PA-C 05/15/19 1224    Blake Divine, MD 05/15/19 [phone]

## 2019-05-15 NOTE — ED Triage Notes (Signed)
R ankle pain since turned it under him yesterday.

## 2019-05-15 NOTE — ED Notes (Signed)
Computer shut down as pt used esig pad

## 2019-06-18 ENCOUNTER — Other Ambulatory Visit: Payer: Self-pay | Admitting: Family Medicine

## 2019-06-18 MED ORDER — SILDENAFIL CITRATE 20 MG PO TABS: ORAL_TABLET | ORAL | 0 refills | Status: DC

## 2019-06-18 NOTE — Telephone Encounter (Signed)
Medication Refill - Medication: sildenafil (REVATIO) 20 MG tablet  requesting 5 mg daily, please advise   Has the patient contacted their pharmacy? Yes.   (Agent: If no, request that the patient contact the pharmacy for the refill.) (Agent: If yes, when and what did the pharmacy advise?)  Preferred Pharmacy (with phone number or street name):  [facility] [address], [place] - [place]  [place] [address]  Phone: [phone] Fax: [phone]     Agent: Please be advised that RX refills may take up to 3 business days. We ask that you follow-up with your pharmacy.

## 2019-06-28 ENCOUNTER — Encounter: Payer: Self-pay | Admitting: Family Medicine

## 2019-06-28 ENCOUNTER — Other Ambulatory Visit: Payer: Self-pay

## 2019-06-28 ENCOUNTER — Ambulatory Visit (INDEPENDENT_AMBULATORY_CARE_PROVIDER_SITE_OTHER): Payer: Commercial Managed Care - PPO | Admitting: Family Medicine

## 2019-06-28 VITALS — BP 124/80 | HR 78 | Temp 98.5°F | Resp 14 | Ht 68.0 in | Wt 177.0 lb

## 2019-06-28 DIAGNOSIS — Z1211 Encounter for screening for malignant neoplasm of colon: Secondary | ICD-10-CM

## 2019-06-28 DIAGNOSIS — Z13220 Encounter for screening for lipoid disorders: Secondary | ICD-10-CM | POA: Diagnosis not present

## 2019-06-28 DIAGNOSIS — Z0000 Encounter for general adult medical examination without abnormal findings: Secondary | ICD-10-CM | POA: Diagnosis not present

## 2019-06-28 DIAGNOSIS — Z125 Encounter for screening for malignant neoplasm of prostate: Secondary | ICD-10-CM

## 2019-06-28 DIAGNOSIS — Z130 Encounter for screening for diseases of the blood and blood-forming organs and certain disorders involving the immune mechanism: Secondary | ICD-10-CM

## 2019-06-28 DIAGNOSIS — R0683 Snoring: Secondary | ICD-10-CM

## 2019-06-28 DIAGNOSIS — G4700 Insomnia, unspecified: Secondary | ICD-10-CM | POA: Insufficient documentation

## 2019-06-28 DIAGNOSIS — Z13228 Encounter for screening for other metabolic disorders: Secondary | ICD-10-CM

## 2019-06-28 DIAGNOSIS — J324 Chronic pansinusitis: Secondary | ICD-10-CM | POA: Insufficient documentation

## 2019-06-28 DIAGNOSIS — Z1329 Encounter for screening for other suspected endocrine disorder: Secondary | ICD-10-CM

## 2019-06-28 DIAGNOSIS — Z720 Tobacco use: Secondary | ICD-10-CM | POA: Insufficient documentation

## 2019-06-28 DIAGNOSIS — N529 Male erectile dysfunction, unspecified: Secondary | ICD-10-CM

## 2019-06-28 DIAGNOSIS — G4733 Obstructive sleep apnea (adult) (pediatric): Secondary | ICD-10-CM | POA: Insufficient documentation

## 2019-06-28 NOTE — Patient Instructions (Addendum)
Preventive Care 40-52 Years Old, Male Preventive care refers to lifestyle choices and visits with your health care provider that can promote health and wellness. This includes:  A yearly physical exam. This is also called an annual well check.  Regular dental and eye exams.  Immunizations.  Screening for certain conditions.  Healthy lifestyle choices, such as eating a healthy diet, getting regular exercise, not using drugs or products that contain nicotine and tobacco, and limiting alcohol use. What can I expect for my preventive care visit? Physical exam Your health care provider will check:  Height and weight. These may be used to calculate body mass index (BMI), which is a measurement that tells if you are at a healthy weight.  Heart rate and blood pressure.  Your skin for abnormal spots. Counseling Your health care provider may ask you questions about:  Alcohol, tobacco, and drug use.  Emotional well-being.  Home and relationship well-being.  Sexual activity.  Eating habits.  Work and work environment. What immunizations do I need?  Influenza (flu) vaccine  This is recommended every year. Tetanus, diphtheria, and pertussis (Tdap) vaccine  You may need a Td booster every 10 years. Varicella (chickenpox) vaccine  You may need this vaccine if you have not already been vaccinated. Zoster (shingles) vaccine  You may need this after age 52. Measles, mumps, and rubella (MMR) vaccine  You may need at least one dose of MMR if you were born in 1957 or later. You may also need a second dose. Pneumococcal conjugate (PCV13) vaccine  You may need this if you have certain conditions and were not previously vaccinated. Pneumococcal polysaccharide (PPSV23) vaccine  You may need one or two doses if you smoke cigarettes or if you have certain conditions. Meningococcal conjugate (MenACWY) vaccine  You may need this if you have certain conditions. Hepatitis A vaccine   You may need this if you have certain conditions or if you travel or work in places where you may be exposed to hepatitis A. Hepatitis B vaccine  You may need this if you have certain conditions or if you travel or work in places where you may be exposed to hepatitis B. Haemophilus influenzae type b (Hib) vaccine  You may need this if you have certain risk factors. Human papillomavirus (HPV) vaccine  If recommended by your health care provider, you may need three doses over 6 months. You may receive vaccines as individual doses or as more than one vaccine together in one shot (combination vaccines). Talk with your health care provider about the risks and benefits of combination vaccines. What tests do I need? Blood tests  Lipid and cholesterol levels. These may be checked every 5 years, or more frequently if you are over 50 years old.  Hepatitis C test.  Hepatitis B test. Screening  Lung cancer screening. You may have this screening every year starting at age 52 if you have a 30-pack-year history of smoking and currently smoke or have quit within the past 15 years.  Prostate cancer screening. Recommendations will vary depending on your family history and other risks.  Colorectal cancer screening. All adults should have this screening starting at age 52 and continuing until age 52. Your health care provider may recommend screening at age 45 if you are at increased risk. You will have tests every 1-10 years, depending on your results and the type of screening test.  Diabetes screening. This is done by checking your blood sugar (glucose) after you have not eaten   for a while (fasting). You may have this done every 1-3 years.  Sexually transmitted disease (STD) testing. Follow these instructions at home: Eating and drinking  Eat a diet that includes fresh fruits and vegetables, whole grains, lean protein, and low-fat dairy products.  Take vitamin and mineral supplements as recommended  by your health care provider.  Do not drink alcohol if your health care provider tells you not to drink.  If you drink alcohol: ? Limit how much you have to 0-2 drinks a day. ? Be aware of how much alcohol is in your drink. In the [place] one drink equals one 12 oz bottle of beer (355 mL), one 5 oz glass of wine (148 mL), or one 1 oz glass of hard liquor (44 mL). Lifestyle  Take daily care of your teeth and gums.  Stay active. Exercise for at least 30 minutes on 5 or more days each week.  Do not use any products that contain nicotine or tobacco, such as cigarettes, e-cigarettes, and chewing tobacco. If you need help quitting, ask your health care provider.  If you are sexually active, practice safe sex. Use a condom or other form of protection to prevent STIs (sexually transmitted infections).  Talk with your health care provider about taking a low-dose aspirin every day starting at age 60. What's next?  Go to your health care provider once a year for a well check visit.  Ask your health care provider how often you should have your eyes and teeth checked.  Stay up to date on all vaccines. This information is not intended to replace advice given to you by your health care provider. Make sure you discuss any questions you have with your health care provider. Document Released: 09/29/2015 Document Revised: 08/27/2018 Document Reviewed: 08/27/2018 Elsevier Patient Education  2020 [facility]) Exercise Recommendation  Being physically active is important to prevent heart disease and stroke, the nation's No. 1and No. 5killers. To improve overall cardiovascular health, we suggest at least 150 minutes per week of moderate exercise or 75 minutes per week of vigorous exercise (or a combination of moderate and vigorous activity). Thirty minutes a day, five times a week is an easy goal to remember. You will also experience benefits even if you divide your time  into two or three segments of 10 to 15 minutes per day.  For people who would benefit from lowering their blood pressure or cholesterol, we recommend 40 minutes of aerobic exercise of moderate to vigorous intensity three to four times a week to lower the risk for heart attack and stroke.  Physical activity is anything that makes you move your body and burn calories.  This includes things like climbing stairs or playing sports. Aerobic exercises benefit your heart, and include walking, jogging, swimming or biking. Strength and stretching exercises are best for overall stamina and flexibility.  The simplest, positive change you can make to effectively improve your heart health is to start walking. It's enjoyable, free, easy, social and great exercise. A walking program is flexible and boasts high success rates because people can stick with it. It's easy for walking to become a regular and satisfying part of life.   For Overall Cardiovascular Health:  At least 30 minutes of moderate-intensity aerobic activity at least 5 days per week for a total of 150  OR   At least 25 minutes of vigorous aerobic activity at least 3 days per week for a total of 75  minutes; or a combination of moderate- and vigorous-intensity aerobic activity  AND   Moderate- to high-intensity muscle-strengthening activity at least 2 days per week for additional health benefits.  For Lowering Blood Pressure and Cholesterol  An average 40 minutes of moderate- to vigorous-intensity aerobic activity 3 or 4 times per week  What if I can't make it to the time goal? Something is always better than nothing! And everyone has to start somewhere. Even if you've been sedentary for years, today is the day you can begin to make healthy changes in your life. If you don't think you'll make it for 30 or 40 minutes, set a reachable goal for today. You can work up toward your overall goal by increasing your time as you get stronger. Don't  let all-or-nothing thinking rob you of doing what you can every day.  Source:http://www.heart.org   American Heart Association (AHA) Exercise Recommendation  Being physically active is important to prevent heart disease and stroke, the nation's No. 1and No. 5killers. To improve overall cardiovascular health, we suggest at least 150 minutes per week of moderate exercise or 75 minutes per week of vigorous exercise (or a combination of moderate and vigorous activity). Thirty minutes a day, five times a week is an easy goal to remember. You will also experience benefits even if you divide your time into two or three segments of 10 to 15 minutes per day.  For people who would benefit from lowering their blood pressure or cholesterol, we recommend 40 minutes of aerobic exercise of moderate to vigorous intensity three to four times a week to lower the risk for heart attack and stroke.  Physical activity is anything that makes you move your body and burn calories.  This includes things like climbing stairs or playing sports. Aerobic exercises benefit your heart, and include walking, jogging, swimming or biking. Strength and stretching exercises are best for overall stamina and flexibility.  The simplest, positive change you can make to effectively improve your heart health is to start walking. It's enjoyable, free, easy, social and great exercise. A walking program is flexible and boasts high success rates because people can stick with it. It's easy for walking to become a regular and satisfying part of life.   For Overall Cardiovascular Health:  At least 30 minutes of moderate-intensity aerobic activity at least 5 days per week for a total of 150  OR   At least 25 minutes of vigorous aerobic activity at least 3 days per week for a total of 75 minutes; or a combination of moderate- and vigorous-intensity aerobic activity  AND   Moderate- to high-intensity muscle-strengthening activity at least 2  days per week for additional health benefits.  For Lowering Blood Pressure and Cholesterol  An average 40 minutes of moderate- to vigorous-intensity aerobic activity 3 or 4 times per week  What if I can't make it to the time goal? Something is always better than nothing! And everyone has to start somewhere. Even if you've been sedentary for years, today is the day you can begin to make healthy changes in your life. If you don't think you'll make it for 30 or 40 minutes, set a reachable goal for today. You can work up toward your overall goal by increasing your time as you get stronger. Don't let all-or-nothing thinking rob you of doing what you can every day.  Source:http://www.heart.org    Preventive Care 63-54 Years Old, Male Preventive care refers to lifestyle choices and visits with your health  care provider that can promote health and wellness. This includes:  A yearly physical exam. This is also called an annual well check.  Regular dental and eye exams.  Immunizations.  Screening for certain conditions.  Healthy lifestyle choices, such as eating a healthy diet, getting regular exercise, not using drugs or products that contain nicotine and tobacco, and limiting alcohol use. What can I expect for my preventive care visit? Physical exam Your health care provider will check:  Height and weight. These may be used to calculate body mass index (BMI), which is a measurement that tells if you are at a healthy weight.  Heart rate and blood pressure.  Your skin for abnormal spots. Counseling Your health care provider may ask you questions about:  Alcohol, tobacco, and drug use.  Emotional well-being.  Home and relationship well-being.  Sexual activity.  Eating habits.  Work and work Statistician. What immunizations do I need?  Influenza (flu) vaccine  This is recommended every year. Tetanus, diphtheria, and pertussis (Tdap) vaccine  You may need a Td booster every  10 years. Varicella (chickenpox) vaccine  You may need this vaccine if you have not already been vaccinated. Zoster (shingles) vaccine  You may need this after age 75. Measles, mumps, and rubella (MMR) vaccine  You may need at least one dose of MMR if you were born in 1957 or later. You may also need a second dose. Pneumococcal conjugate (PCV13) vaccine  You may need this if you have certain conditions and were not previously vaccinated. Pneumococcal polysaccharide (PPSV23) vaccine  You may need one or two doses if you smoke cigarettes or if you have certain conditions. Meningococcal conjugate (MenACWY) vaccine  You may need this if you have certain conditions. Hepatitis A vaccine  You may need this if you have certain conditions or if you travel or work in places where you may be exposed to hepatitis A. Hepatitis B vaccine  You may need this if you have certain conditions or if you travel or work in places where you may be exposed to hepatitis B. Haemophilus influenzae type b (Hib) vaccine  You may need this if you have certain risk factors. Human papillomavirus (HPV) vaccine  If recommended by your health care provider, you may need three doses over 6 months. You may receive vaccines as individual doses or as more than one vaccine together in one shot (combination vaccines). Talk with your health care provider about the risks and benefits of combination vaccines. What tests do I need? Blood tests  Lipid and cholesterol levels. These may be checked every 5 years, or more frequently if you are over 30 years old.  Hepatitis C test.  Hepatitis B test. Screening  Lung cancer screening. You may have this screening every year starting at age 41 if you have a 30-pack-year history of smoking and currently smoke or have quit within the past 15 years.  Prostate cancer screening. Recommendations will vary depending on your family history and other risks.  Colorectal cancer  screening. All adults should have this screening starting at age 57 and continuing until age 81. Your health care provider may recommend screening at age 45 if you are at increased risk. You will have tests every 1-10 years, depending on your results and the type of screening test.  Diabetes screening. This is done by checking your blood sugar (glucose) after you have not eaten for a while (fasting). You may have this done every 1-3 years.  Sexually transmitted disease (  STD) testing. Follow these instructions at home: Eating and drinking  Eat a diet that includes fresh fruits and vegetables, whole grains, lean protein, and low-fat dairy products.  Take vitamin and mineral supplements as recommended by your health care provider.  Do not drink alcohol if your health care provider tells you not to drink.  If you drink alcohol: ? Limit how much you have to 0-2 drinks a day. ? Be aware of how much alcohol is in your drink. In the [place] one drink equals one 12 oz bottle of beer (355 mL), one 5 oz glass of wine (148 mL), or one 1 oz glass of hard liquor (44 mL). Lifestyle  Take daily care of your teeth and gums.  Stay active. Exercise for at least 30 minutes on 5 or more days each week.  Do not use any products that contain nicotine or tobacco, such as cigarettes, e-cigarettes, and chewing tobacco. If you need help quitting, ask your health care provider.  If you are sexually active, practice safe sex. Use a condom or other form of protection to prevent STIs (sexually transmitted infections).  Talk with your health care provider about taking a low-dose aspirin every day starting at age 22. What's next?  Go to your health care provider once a year for a well check visit.  Ask your health care provider how often you should have your eyes and teeth checked.  Stay up to date on all vaccines. This information is not intended to replace advice given to you by your health care provider. Make  sure you discuss any questions you have with your health care provider. Document Released: 09/29/2015 Document Revised: 08/27/2018 Document Reviewed: 08/27/2018 Elsevier Patient Education  2020 [employer].

## 2019-06-28 NOTE — Progress Notes (Signed)
Patient: Jason Duncan, Male    DOB: [birth year], 53 y.o.   MRN: [id number] Arnetha Courser, MD Visit Date: 06/28/2019  Today's Provider: Delsa Grana, PA-C   Chief Complaint  Patient presents with  . Annual Exam   Subjective:   Annual physical exam:  Jason Duncan is a 53 y.o. male who presents today for health maintenance and annual & complete physical exam.  He feels well.  He reports exercising - does a lot of walking at work, does some walking at home as well at least 15 min a day  Diet:  "eat normal" has cut out a lot of sweets - cakes and ice cream, has lost weight, last year was told to loose 10 lbs and he lost more than that, he's been eating fruits and vegetables BMI today 26.91, down from last year Wt Readings from Last 5 Encounters:  06/28/19 177 lb (80.3 kg)  05/15/19 186 lb (84.4 kg)  02/27/19 185 lb (83.9 kg)  08/27/18 185 lb 12.8 oz (84.3 kg)  08/10/18 188 lb 11.2 oz (85.6 kg)   BMI Readings from Last 5 Encounters:  06/28/19 26.91 kg/m  05/15/19 28.28 kg/m  02/27/19 28.13 kg/m  08/27/18 28.67 kg/m  08/10/18 28.69 kg/m    He reports he is sleeping poorly - "not worth two cent" did sleep study and dx with OSA last year, he can't get comfortable and so not able to wear and sleep effectively -  He had the wrong size mask initially - they sent him a new size mask he states that still does not feel the same size it is uncomfortable and he lays on his left side and mask prevents him from getting to sleep- Vincent did sleep study, went to [facility] for supplies he will call to follow-up on refitting devices and getting a chinstrap etc.   He snores - has "very serious sinus problems"  He's lost weight but still sleeps poorly, wife says he snores, his sinuses recently very inflamed and congested He's on zyrtec D and temporarily used flonase - it helped for a little bit but then it made sx worse.   He was specialist in the past and "one side collapsed"  He  states that for 5 years ago he was told he needed surgeries but could not afford it at that time it was a local ENT physician, but he does not remember the name.  He uses medications daily has sinus headaches around his eyes and in both of his cheeks it seems alternate sides.  He has a lot of burning postnasal drip and scratchy sore throat.  ED- takes 20 mg sildenafil -does not work very well for him.  He states that he has a sex drive, can get an erection but has trouble sustaining an erection towards completion.  20 mg does not help him very much he has tried increasing to 40 mg but it gives him a severe headache.  He does not believe he is ever had any lab work-up or seen a specialist for it in the past.  USPSTF grade A and B recommendations - reviewed and addressed today  Depression:  Phq 9 completed today by patient, was reviewed by me with patient in the room, score is  negative, pt feels good overall for mood PHQ 2/9 Scores 03/16/2019 03/02/2019 08/10/2018 07/09/2018  PHQ - 2 Score 0 0 0 0  PHQ- 9 Score 0 0 0 -   Depression screen [facility] 2/9  03/16/2019 03/02/2019 08/10/2018 07/09/2018 06/24/2017  Decreased Interest 0 0 0 0 0  Down, Depressed, Hopeless 0 0 0 0 2  PHQ - 2 Score 0 0 0 0 2  Altered sleeping 0 0 0 - 3  Tired, decreased energy 0 0 0 - 0  Change in appetite 0 0 0 - 0  Feeling bad or failure about yourself  0 0 0 - 1  Trouble concentrating 0 0 0 - 0  Moving slowly or fidgety/restless 0 0 0 - 0  Suicidal thoughts 0 0 0 - 1  PHQ-9 Score 0 0 0 - 7  Difficult doing work/chores Not difficult at all Not difficult at all Not difficult at all - Somewhat difficult    Hep C Screening: none indicated per age/birth year  STD testing and prevention (HIV/chl/gon/syphilis): deferred -declines HIV screening and states has been in a monogamous relationship with his wife does not want any other STD testing either  Prostate cancer: PSA lab done last year, has some urinary sx, no change from last  year.  Has never been to urology.   Prostate cancer screening with PSA: Discussed risks and benefits of PSA testing and provided handout. Pt would like to have PSA drawn today. Lab Results  Component Value Date   PSA 1.2 07/09/2018   Intimate partner violence:  None, denies Urinary Symptoms:  IPSS Questionnaire (AUA-7): Over the past month.   1)  How often have you had a sensation of not emptying your bladder completely after you finish urinating?  0 - Not at all  2)  How often have you had to urinate again less than two hours after you finished urinating? 1 - Less than 1 time in 5  3)  How often have you found you stopped and started again several times when you urinated?  1 - Less than 1 time in 5  4) How difficult have you found it to postpone urination?  1 - Less than 1 time in 5  5) How often have you had a weak urinary stream?  0 - Not at all  6) How often have you had to push or strain to begin urination?  0 - Not at all  7) How many times did you most typically get up to urinate from the time you went to bed until the time you got up in the morning?  1 - 1 time  Total score:  0-7 mildly symptomatic   8-19 moderately symptomatic   20-35 severely symptomatic    Advanced Care Planning:  A voluntary discussion about advance care planning including the explanation and discussion of advance directives.  Discussed health care proxy and Living will, and the patient was able to identify a health care proxy as wife - Drago Mckendrick.  Patient does not have a living will at present time. If patient does have living will, I have requested they bring this to the clinic to be scanned in to their chart.    Colorectal cancer:  colonoscopy is due - ordering     Lung cancer:   Low Dose CT Chest recommended if Age 57-80 years, 30 pack-year currently smoking OR have quit w/in 15years. Patient does not qualify.   Social History   Tobacco Use  . Smoking status: Never Smoker  . Smokeless  tobacco: Current User    Types: Chew  Substance Use Topics  . Alcohol use: No     Alcohol screening:   Office Visit from 03/16/2019 in [facility]  AUDIT-C Score  0     AAA:  The USPSTF recommends one-time screening with ultrasonography in men ages 17 to 61 years who have ever smoked  [id number]  Blood pressure/Hypertension: BP Readings from Last 3 Encounters:  06/28/19 124/80  05/15/19 118/85  03/16/19 130/84   Weight/Obesity: Wt Readings from Last 3 Encounters:  06/28/19 177 lb (80.3 kg)  05/15/19 186 lb (84.4 kg)  02/27/19 185 lb (83.9 kg)   BMI Readings from Last 3 Encounters:  06/28/19 26.91 kg/m  05/15/19 28.28 kg/m  02/27/19 28.13 kg/m    Lipids:  Lab Results  Component Value Date   CHOL 178 07/09/2018   CHOL 162 06/24/2017   Lab Results  Component Value Date   HDL 37 (L) 07/09/2018   HDL 39 (L) 06/24/2017   Lab Results  Component Value Date   LDLCALC 119 (H) 07/09/2018   LDLCALC 100 (H) 06/24/2017   Lab Results  Component Value Date   TRIG 113 07/09/2018   TRIG 132 06/24/2017   Lab Results  Component Value Date   CHOLHDL 4.8 07/09/2018   CHOLHDL 4.2 06/24/2017   No results found for: LDLDIRECT Based on the results of lipid panel his/her cardiovascular risk factor ( using [place] )  in the next 10 years is : The 10-year ASCVD risk score Mikey Bussing DC Brooke Bonito., et al., 2013) is: 4.2%   Values used to calculate the score:     Age: 46 years     Sex: Male     Is Non-Hispanic African American: No     Diabetic: No     Tobacco smoker: No     Systolic Blood Pressure: A999333 mmHg     Is BP treated: No     HDL Cholesterol: 37 mg/dL     Total Cholesterol: 178 mg/dL Glucose:  Glucose  Date Value Ref Range Status  07/27/2012 100 (H) 65 - 99 mg/dL Final   Glucose, Bld  Date Value Ref Range Status  02/27/2019 103 (H) 70 - 99 mg/dL Final  07/09/2018 90 65 - 99 mg/dL Final    Comment:    .            Fasting reference interval .    06/24/2017 90 65 - 99 mg/dL Final    Comment:    .            Fasting reference interval .     Social History      He  reports that he has never smoked. His smokeless tobacco use includes chew. He reports that he does not drink alcohol or use drugs.       Social History   Socioeconomic History  . Marital status: Married    Spouse name: Sharyn Lull   . Number of children: 3  . Years of education: Not on file  . Highest education level: Some college, no degree  Occupational History  . Not on file  Social Needs  . Financial resource strain: Somewhat hard  . Food insecurity    Worry: Never true    Inability: Never true  . Transportation needs    Medical: No    Non-medical: No  Tobacco Use  . Smoking status: Never Smoker  . Smokeless tobacco: Current User    Types: Chew  Substance and Sexual Activity  . Alcohol use: No  . Drug use: No    Comment: used to smoke pot from [age] y old   . Sexual activity:  Not Currently  Lifestyle  . Physical activity    Days per week: 1 day    Minutes per session: 20 min  . Stress: Only a little  Relationships  . Social Herbalist on phone: Twice a week    Gets together: Once a week    Attends religious service: More than 4 times per year    Active member of club or organization: No    Attends meetings of clubs or organizations: Never    Relationship status: Married  Other Topics Concern  . Not on file  Social History Narrative  . Not on file         Social History   Socioeconomic History  . Marital status: Married    Spouse name: Sharyn Lull   . Number of children: 3  . Years of education: Not on file  . Highest education level: Some college, no degree  Occupational History  . Not on file  Social Needs  . Financial resource strain: Somewhat hard  . Food insecurity    Worry: Never true    Inability: Never true  . Transportation needs    Medical: No    Non-medical: No  Tobacco Use  . Smoking status: Never Smoker   . Smokeless tobacco: Current User    Types: Chew  Substance and Sexual Activity  . Alcohol use: No  . Drug use: No    Comment: used to smoke pot from [age] y old   . Sexual activity: Not Currently  Lifestyle  . Physical activity    Days per week: 1 day    Minutes per session: 20 min  . Stress: Only a little  Relationships  . Social Herbalist on phone: Twice a week    Gets together: Once a week    Attends religious service: More than 4 times per year    Active member of club or organization: No    Attends meetings of clubs or organizations: Never    Relationship status: Married  . Intimate partner violence    Fear of current or ex partner: No    Emotionally abused: No    Physically abused: No    Forced sexual activity: No  Other Topics Concern  . Not on file  Social History Narrative  . Not on file    Family History        Family Status  Relation Name Status  . Mother  Alive  . Father  Alive  . Sister 1/2 Alive  . Son  Alive  . MGM  Deceased       fall, pneumonia  . MGF  Deceased       lung cancer  . PGM  Deceased       unknown  . PGF  Deceased       unknown  . Sister 1/2 Alive  . Son  Alive  . Son  Alive  . Brother  Deceased        His family history includes Cancer in his maternal grandfather; Heart disease in his brother; Hypertension in his mother; Pneumonia in his maternal grandmother.       Family History  Problem Relation Age of Onset  . Hypertension Mother   . Pneumonia Maternal Grandmother   . Cancer Maternal Grandfather        lung  . Heart disease Brother        60 years old, died from a heart attack  Patient Active Problem List   Diagnosis Date Noted  . Hiatal hernia with GERD 03/02/2019  . Erectile dysfunction 06/24/2017  . Vitamin D deficiency 06/24/2017    Past Surgical History:  Procedure Laterality Date  . EXTERNAL EAR SURGERY Bilateral 1973  . LEG SURGERY Right 1988   rods placed due to broken leg  . SHOULDER  SURGERY Right 2003     Current Outpatient Medications:  .  pantoprazole (PROTONIX) 40 MG tablet, Take 1 tablet (40 mg total) by mouth daily as needed (acid reflux)., Disp: 90 tablet, Rfl: 0 .  fluticasone (FLONASE) 50 MCG/ACT nasal spray, Place 2 sprays into both nostrils daily. (Patient not taking: Reported on 06/28/2019), Disp: 16 g, Rfl: 6 .  sildenafil (REVATIO) 20 MG tablet, TAKE ONE TO THREE TABLETS BY MOUTH DAILY AS NEEDED (Patient not taking: Reported on 06/28/2019), Disp: 30 tablet, Rfl: 0  Allergies  Allergen Reactions  . Sulfur     Patient Care Team: Lada, Satira Anis, MD as PCP - General (Family Medicine)  I personally reviewed active problem list, medication list, allergies, family history, social history, health maintenance, notes from last encounter, lab results, imaging with the patient/caregiver today.  Review of Systems  Constitutional: Negative.  Negative for activity change, appetite change, fatigue and unexpected weight change.  HENT: Negative.   Eyes: Negative.   Respiratory: Negative.  Negative for shortness of breath.   Cardiovascular: Negative.  Negative for chest pain, palpitations and leg swelling.  Gastrointestinal: Negative.  Negative for abdominal pain and blood in stool.  Endocrine: Negative.   Genitourinary: Negative.  Negative for decreased urine volume, difficulty urinating, testicular pain and urgency.  Skin: Negative.  Negative for color change and pallor.  Allergic/Immunologic: Negative.   Neurological: Negative.  Negative for syncope, weakness, light-headedness and numbness.  Psychiatric/Behavioral: Negative.  Negative for confusion, dysphoric mood, self-injury and suicidal ideas. The patient is not nervous/anxious.   All other systems reviewed and are negative.         Objective:   Vitals:  Vitals:   06/28/19 1002  BP: 124/80  Pulse: 78  Resp: 14  Temp: 98.5 F (36.9 C)  SpO2: 98%  Weight: 177 lb (80.3 kg)  Height: 5\' 8"  (1.727 m)     Body mass index is 26.91 kg/m.  Physical Exam Vitals signs and nursing note reviewed.  Constitutional:      General: He is not in acute distress.    Appearance: Normal appearance. He is well-developed. He is not ill-appearing, toxic-appearing or diaphoretic.  HENT:     Head: Normocephalic and atraumatic.     Jaw: No trismus.     Right Ear: Tympanic membrane, ear canal and external ear normal. There is no impacted cerumen.     Left Ear: Tympanic membrane, ear canal and external ear normal. There is no impacted cerumen.     Nose: Septal deviation, mucosal edema, congestion and rhinorrhea present. No nasal tenderness.     Right Turbinates: Swollen.     Left Turbinates: Swollen.     Right Sinus: No maxillary sinus tenderness or frontal sinus tenderness.     Left Sinus: No maxillary sinus tenderness or frontal sinus tenderness.     Comments: Slightly asymmetrical nose with septum deviated to his right more congested on the right side, diffuse nasal mucosal erythema and edema    Mouth/Throat:     Mouth: Mucous membranes are moist.     Pharynx: Oropharynx is clear. Uvula midline. No oropharyngeal exudate, posterior oropharyngeal  erythema or uvula swelling.  Eyes:     General: Lids are normal. No scleral icterus.       Right eye: No discharge.        Left eye: No discharge.     Conjunctiva/sclera: Conjunctivae normal.     Pupils: Pupils are equal, round, and reactive to light.  Neck:     Musculoskeletal: Normal range of motion and neck supple.     Trachea: Trachea and phonation normal. No tracheal deviation.  Cardiovascular:     Rate and Rhythm: Normal rate and regular rhythm.     Pulses: Normal pulses.          Radial pulses are 2+ on the right side and 2+ on the left side.       Posterior tibial pulses are 2+ on the right side and 2+ on the left side.     Heart sounds: Normal heart sounds. No murmur. No friction rub. No gallop.   Pulmonary:     Effort: Pulmonary effort is normal.      Breath sounds: Normal breath sounds. No wheezing, rhonchi or rales.  Abdominal:     General: Bowel sounds are normal. There is no distension.     Palpations: Abdomen is soft.     Tenderness: There is no abdominal tenderness. There is no guarding or rebound.  Musculoskeletal: Normal range of motion.     Right lower leg: No edema.     Left lower leg: No edema.  Lymphadenopathy:     Cervical: No cervical adenopathy.  Skin:    General: Skin is warm and dry.     Capillary Refill: Capillary refill takes less than 2 seconds.     Coloration: Skin is not jaundiced or pale.     Findings: No rash.  Neurological:     Mental Status: He is alert and oriented to person, place, and time.     Motor: No weakness.     Coordination: Coordination normal.     Gait: Gait normal.  Psychiatric:        Mood and Affect: Mood normal.        Speech: Speech normal.        Behavior: Behavior normal.    PHQ2/9: Depression screen [facility] 2/9 03/16/2019 03/02/2019 08/10/2018 07/09/2018 06/24/2017  Decreased Interest 0 0 0 0 0  Down, Depressed, Hopeless 0 0 0 0 2  PHQ - 2 Score 0 0 0 0 2  Altered sleeping 0 0 0 - 3  Tired, decreased energy 0 0 0 - 0  Change in appetite 0 0 0 - 0  Feeling bad or failure about yourself  0 0 0 - 1  Trouble concentrating 0 0 0 - 0  Moving slowly or fidgety/restless 0 0 0 - 0  Suicidal thoughts 0 0 0 - 1  PHQ-9 Score 0 0 0 - 7  Difficult doing work/chores Not difficult at all Not difficult at all Not difficult at all - Somewhat difficult    Fall Risk: Fall Risk  03/16/2019 03/02/2019 08/10/2018 07/09/2018 06/24/2017  Falls in the past year? 0 0 0 No No  Number falls in past yr: 0 0 0 - -  Injury with Fall? 0 - - - -     Assessment & Plan:    CPE completed today  . Prostate cancer screening and PSA options (with potential risks and benefits of testing vs not testing) were discussed along with recent recs/guidelines, shared decision making and handout/information given to  pt  today  . USPSTF grade A and B recommendations reviewed with patient; age-appropriate recommendations, preventive care, screening tests, etc discussed and encouraged; healthy living encouraged; see AVS for patient education given to patient  . Discussed importance of 150 minutes of physical activity weekly, AHA exercise recommendations given to pt in AVS/handout  . Discussed importance of healthy diet:  eating lean meats and proteins, avoiding trans fats and saturated fats, avoid simple sugars and excessive carbs in diet, eat 6 servings of fruit/vegetables daily and drink plenty of water and avoid sweet beverages.   . Recommended pt to do annual eye exam and routine dental exams/cleanings  . Reviewed Health Maintenance: Health Maintenance  Topic Date Due  . COLONOSCOPY  08/08/2017  . HIV Screening  08/11/2019 (Originally 08/08/1981)  . TETANUS/TDAP  09/16/2024  . INFLUENZA VACCINE  Completed    . Immunizations: Immunization History  Administered Date(s) Administered  . Influenza-Unspecified 06/18/2019       ICD-10-CM   1. Adult general medical exam  Z00.00 CBC with Differential/Platelet    COMPLETE METABOLIC PANEL WITH GFR    Lipid panel    Hemoglobin A1c    TSH    PSA    Ambulatory referral to Gastroenterology  2. Screening for malignant neoplasm of colon  Z12.11 Ambulatory referral to Gastroenterology  3. Screening for lipoid disorders  Z13.220 Lipid panel  4. Screening for endocrine, metabolic and immunity disorder  Z13.29 CBC with Differential/Platelet   [id number] COMPLETE METABOLIC PANEL WITH GFR   Z13.0 Lipid panel    Hemoglobin A1c    TSH  5. Screening for malignant neoplasm of prostate  Z12.5 PSA  6. Chronic pansinusitis  J32.4 Ambulatory referral to ENT   told needs surgery in the past, on meds constantly, frequent infections and sinus HA's to around eyes and maxillary sinus  7. OSA (obstructive sleep apnea)  G47.33    Patient to follow-up with device fit- encouraged  him to let us know if he needs assitance, he declinced referrals back to [facility]  8. Erectile dysfunction, unspecified erectile dysfunction type  N52.9   9. Snoring  R06.83 Ambulatory referral to ENT   nasal septal deviation + rhinitis/rhinosinusitis - very symptomatic, encouraged 2nd gen antihistamine use, saline nasal spray, f/up ENT  10. Insomnia, unspecified type  G47.00 Ambulatory referral to ENT   From obstructive sleep apnea and likely also from ENT issues  11. Tobacco chew use  Z72.0    Advised the risk of cancers, patient seeing dentist regularly, no concerns of lesions or ulcers patches, no dysphagia    Signed forms for his employer stating that he has completed his physical this year   Delsa Grana, PA-C 06/28/19 10:20 AM  [place]

## 2019-06-29 LAB — CBC WITH DIFFERENTIAL/PLATELET
Absolute Monocytes: 252 cells/uL (ref 200–950)
Basophils Absolute: 29 cells/uL (ref 0–200)
Basophils Relative: 0.7 %
Eosinophils Absolute: 8 cells/uL — ABNORMAL LOW (ref 15–500)
Eosinophils Relative: 0.2 %
HCT: 42.7 % (ref 38.5–50.0)
Hemoglobin: 14.4 g/dL (ref 13.2–17.1)
Lymphs Abs: 1189 cells/uL (ref 850–3900)
MCH: 30.9 pg (ref 27.0–33.0)
MCHC: 33.7 g/dL (ref 32.0–36.0)
MCV: 91.6 fL (ref 80.0–100.0)
MPV: 9.2 fL (ref 7.5–12.5)
Monocytes Relative: 6 %
Neutro Abs: 2722 cells/uL (ref 1500–7800)
Neutrophils Relative %: 64.8 %
Platelets: 245 10*3/uL (ref 140–400)
RBC: 4.66 10*6/uL (ref 4.20–5.80)
RDW: 12.2 % (ref 11.0–15.0)
Total Lymphocyte: 28.3 %
WBC: 4.2 10*3/uL (ref 3.8–10.8)

## 2019-06-29 LAB — COMPLETE METABOLIC PANEL WITH GFR
AG Ratio: 2 (calc) (ref 1.0–2.5)
ALT: 11 U/L (ref 9–46)
AST: 13 U/L (ref 10–35)
Albumin: 4.5 g/dL (ref 3.6–5.1)
Alkaline phosphatase (APISO): 55 U/L (ref 35–144)
BUN: 18 mg/dL (ref 7–25)
CO2: 27 mmol/L (ref 20–32)
Calcium: 9.9 mg/dL (ref 8.6–10.3)
Chloride: 106 mmol/L (ref 98–110)
Creat: 0.9 mg/dL (ref 0.70–1.33)
GFR, Est African American: 114 mL/min/{1.73_m2} (ref >=60)
GFR, Est Non African American: 99 mL/min/{1.73_m2} (ref >=60)
Globulin: 2.3 g/dL (calc) (ref 1.9–3.7)
Glucose, Bld: 108 mg/dL — ABNORMAL HIGH (ref 65–99)
Potassium: 4.5 mmol/L (ref 3.5–5.3)
Sodium: 143 mmol/L (ref 135–146)
Total Bilirubin: 0.6 mg/dL (ref 0.2–1.2)
Total Protein: 6.8 g/dL (ref 6.1–8.1)

## 2019-06-29 LAB — LIPID PANEL
Cholesterol: 138 mg/dL (ref <200)
HDL: 36 mg/dL — ABNORMAL LOW (ref >=40)
LDL Cholesterol (Calc): 82 mg/dL (calc)
Non-HDL Cholesterol (Calc): 102 mg/dL (calc) (ref <130)
Total CHOL/HDL Ratio: 3.8 (calc) (ref <5.0)
Triglycerides: 108 mg/dL (ref <150)

## 2019-06-29 LAB — HEMOGLOBIN A1C
Hgb A1c MFr Bld: 5.3 % of total Hgb (ref <5.7)
Mean Plasma Glucose: 105 (calc)
eAG (mmol/L): 5.8 (calc)

## 2019-06-29 LAB — PSA: PSA: 1 ng/mL (ref <=4.0)

## 2019-06-29 LAB — TSH: TSH: 0.95 mIU/L (ref 0.40–4.50)

## 2019-07-09 ENCOUNTER — Encounter: Payer: Self-pay | Admitting: *Deleted

## 2019-07-12 ENCOUNTER — Telehealth: Payer: Self-pay

## 2019-07-12 NOTE — Telephone Encounter (Signed)
Returned patients call.  LVM for pt to call office to schedule colonoscopy.  Thanks Ayme Short 

## 2019-07-13 ENCOUNTER — Telehealth: Payer: Self-pay

## 2019-07-13 ENCOUNTER — Other Ambulatory Visit: Payer: Self-pay

## 2019-07-13 DIAGNOSIS — Z1211 Encounter for screening for malignant neoplasm of colon: Secondary | ICD-10-CM

## 2019-07-13 NOTE — Telephone Encounter (Signed)
Gastroenterology Pre-Procedure Review  Request Date: 08/03/19 Requesting Physician: Dr. Vicente Males  PATIENT REVIEW QUESTIONS: The patient responded to the following health history questions as indicated:    1. Are you having any GI issues? yes (change in bowel habits declined appt.) 2. Do you have a personal history of Polyps? no 3. Do you have a family history of Colon Cancer or Polyps? no 4. Diabetes Mellitus? no 5. Joint replacements in the past 12 months?no 6. Major health problems in the past 3 months?no 7. Any artificial heart valves, MVP, or defibrillator?no    MEDICATIONS & ALLERGIES:    Patient reports the following regarding taking any anticoagulation/antiplatelet therapy:   Plavix, Coumadin, Eliquis, Xarelto, Lovenox, Pradaxa, Brilinta, or Effient? no Aspirin? no  Patient confirms/reports the following medications:  Current Outpatient Medications  Medication Sig Dispense Refill  . fluticasone (FLONASE) 50 MCG/ACT nasal spray Place 2 sprays into both nostrils daily. (Patient not taking: Reported on 06/28/2019) 16 g 6  . pantoprazole (PROTONIX) 40 MG tablet Take 1 tablet (40 mg total) by mouth daily as needed (acid reflux). 90 tablet 0  . sildenafil (REVATIO) 20 MG tablet TAKE ONE TO THREE TABLETS BY MOUTH DAILY AS NEEDED (Patient not taking: Reported on 06/28/2019) 30 tablet 0   No current facility-administered medications for this visit.     Patient confirms/reports the following allergies:  Allergies  Allergen Reactions  . Sulfur     No orders of the defined types were placed in this encounter.   AUTHORIZATION INFORMATION Primary Insurance: 1D#: Group #:  Secondary Insurance: 1D#: Group #:  SCHEDULE INFORMATION: Date: 08/03/19 Time: Location:[facility]

## 2019-07-30 ENCOUNTER — Other Ambulatory Visit
Admission: RE | Admit: 2019-07-30 | Discharge: 2019-07-30 | Disposition: A | Discharge status: Home / Self Care | Payer: Commercial Managed Care - PPO | Source: Ambulatory Visit | Attending: Gastroenterology | Admitting: Gastroenterology

## 2019-07-30 ENCOUNTER — Other Ambulatory Visit: Payer: Self-pay

## 2019-07-30 DIAGNOSIS — Z01812 Encounter for preprocedural laboratory examination: Secondary | ICD-10-CM | POA: Diagnosis not present

## 2019-07-30 DIAGNOSIS — Z20828 Contact with and (suspected) exposure to other viral communicable diseases: Secondary | ICD-10-CM | POA: Insufficient documentation

## 2019-07-30 LAB — SARS CORONAVIRUS 2 (TAT 6-24 HRS): SARS Coronavirus 2: NEGATIVE

## 2019-08-02 ENCOUNTER — Telehealth: Payer: Self-pay

## 2019-08-02 ENCOUNTER — Telehealth: Payer: Self-pay | Admitting: Gastroenterology

## 2019-08-02 NOTE — Telephone Encounter (Signed)
LVM for pt to cal to the office in regards to his bowel prep. We have a bowel prep he can come by to pick up.  Thanks Jason Duncan

## 2019-08-02 NOTE — Telephone Encounter (Signed)
Patient has returned call back.  He will stop by the office to pick up prep today.  Thanks [employer]

## 2019-08-02 NOTE — Telephone Encounter (Signed)
Patient called & states he went to pick up his prep for his colonoscopy & it was $40.00. He has the money but last year it was offered to him free. He states we should offer this free since it's part of the preventative. I offered for [place] to call in a cheaper prep.If medication is called in call to [facility].Please advise.

## 2019-08-03 ENCOUNTER — Encounter: Payer: Self-pay | Admitting: Anesthesiology

## 2019-08-03 ENCOUNTER — Ambulatory Visit
Admission: RE | Admit: 2019-08-03 | Discharge: 2019-08-03 | Disposition: A | Discharge status: Home / Self Care | Payer: Commercial Managed Care - PPO | Attending: Gastroenterology | Admitting: Gastroenterology

## 2019-08-03 ENCOUNTER — Encounter
Admission: RE | Disposition: A | Discharge status: Home / Self Care | Payer: Self-pay | Source: Home / Self Care | Attending: Gastroenterology

## 2019-08-03 ENCOUNTER — Ambulatory Visit: Payer: Commercial Managed Care - PPO | Admitting: Registered Nurse

## 2019-08-03 ENCOUNTER — Other Ambulatory Visit: Payer: Self-pay

## 2019-08-03 DIAGNOSIS — K219 Gastro-esophageal reflux disease without esophagitis: Secondary | ICD-10-CM | POA: Insufficient documentation

## 2019-08-03 DIAGNOSIS — Z87442 Personal history of urinary calculi: Secondary | ICD-10-CM | POA: Diagnosis not present

## 2019-08-03 DIAGNOSIS — Z79899 Other long term (current) drug therapy: Secondary | ICD-10-CM | POA: Diagnosis not present

## 2019-08-03 DIAGNOSIS — Z1211 Encounter for screening for malignant neoplasm of colon: Secondary | ICD-10-CM

## 2019-08-03 DIAGNOSIS — Z882 Allergy status to sulfonamides status: Secondary | ICD-10-CM | POA: Insufficient documentation

## 2019-08-03 DIAGNOSIS — D122 Benign neoplasm of ascending colon: Secondary | ICD-10-CM | POA: Diagnosis not present

## 2019-08-03 DIAGNOSIS — G4733 Obstructive sleep apnea (adult) (pediatric): Secondary | ICD-10-CM | POA: Diagnosis not present

## 2019-08-03 DIAGNOSIS — D120 Benign neoplasm of cecum: Secondary | ICD-10-CM | POA: Diagnosis not present

## 2019-08-03 DIAGNOSIS — G4700 Insomnia, unspecified: Secondary | ICD-10-CM | POA: Insufficient documentation

## 2019-08-03 HISTORY — DX: Gastro-esophageal reflux disease without esophagitis: K21.9

## 2019-08-03 HISTORY — PX: COLONOSCOPY WITH PROPOFOL: SHX5780

## 2019-08-03 HISTORY — DX: Personal history of urinary calculi: Z87.442

## 2019-08-03 SURGERY — COLONOSCOPY WITH PROPOFOL
Anesthesia: General

## 2019-08-03 MED ORDER — SODIUM CHLORIDE 0.9 % IV SOLN
INTRAVENOUS | Status: DC
  Administered 2019-08-03: 08:00:00 via INTRAVENOUS

## 2019-08-03 MED ORDER — GLYCOPYRROLATE 0.2 MG/ML IJ SOLN
INTRAMUSCULAR | Status: AC
  Filled 2019-08-03: qty 1

## 2019-08-03 MED ORDER — LIDOCAINE HCL (PF) 2 % IJ SOLN
INTRAMUSCULAR | Status: AC
  Filled 2019-08-03: qty 10

## 2019-08-03 MED ORDER — PROPOFOL 10 MG/ML IV BOLUS
INTRAVENOUS | Status: DC | PRN
  Administered 2019-08-03: 70 mg via INTRAVENOUS

## 2019-08-03 MED ORDER — PROPOFOL 10 MG/ML IV BOLUS
INTRAVENOUS | Status: AC
  Filled 2019-08-03: qty 20

## 2019-08-03 MED ORDER — PROPOFOL 500 MG/50ML IV EMUL
INTRAVENOUS | Status: DC | PRN
  Administered 2019-08-03: 140 ug/kg/min via INTRAVENOUS

## 2019-08-03 MED ORDER — LIDOCAINE HCL (PF) 2 % IJ SOLN
INTRAMUSCULAR | Status: AC
  Filled 2019-08-03: qty 20

## 2019-08-03 MED ORDER — PROPOFOL 500 MG/50ML IV EMUL
INTRAVENOUS | Status: AC
  Filled 2019-08-03: qty 200

## 2019-08-03 NOTE — Op Note (Signed)
[facility] Patient Name: Jason Duncan Procedure Date: 08/03/2019 7:30 AM MRN: [id number] Account #: [id number] Date of Birth: 1966/07/29 Admit Type: Outpatient Age: 53 Room: [facility] ENDO ROOM 1 Gender: Male Note Status: Finalized Procedure:             Colonoscopy Indications:           Screening for colorectal malignant neoplasm Providers:             Jonathon Bellows MD, MD Medicines:             Monitored Anesthesia Care Complications:         No immediate complications. Procedure:             Pre-Anesthesia Assessment:                        - Prior to the procedure, a History and Physical was                         performed, and patient medications, allergies and                         sensitivities were reviewed. The patient's tolerance                         of previous anesthesia was reviewed.                        - The risks and benefits of the procedure and the                         sedation options and risks were discussed with the                         patient. All questions were answered and informed                         consent was obtained.                        - ASA Grade Assessment: II - A patient with mild                         systemic disease.                        After obtaining informed consent, the colonoscope was                         passed under direct vision. Throughout the procedure,                         the patient's blood pressure, pulse, and oxygen                         saturations were monitored continuously. The                         Colonoscope was introduced through the anus and  advanced to the the cecum, identified by the                         appendiceal orifice. The colonoscopy was performed                         with ease. The patient tolerated the procedure well.                         The quality of the bowel preparation was excellent. Findings:  The perianal and digital rectal examinations were normal.      Four sessile polyps were found in the ascending colon. The polyps were 6       to 8 mm in size. These polyps were removed with a cold snare. Resection       and retrieval were complete.      A 4 mm polyp was found in the cecum. The polyp was sessile. The polyp       was removed with a cold snare. Resection and retrieval were complete.      The exam was otherwise without abnormality on direct and retroflexion       views. Impression:            - Four 6 to 8 mm polyps in the ascending colon,                         removed with a cold snare. Resected and retrieved.                        - One 4 mm polyp in the cecum, removed with a cold                         snare. Resected and retrieved.                        - The examination was otherwise normal on direct and                         retroflexion views. Recommendation:        - Discharge patient to home (with escort).                        - Resume previous diet.                        - Continue present medications.                        - Await pathology results.                        - Repeat colonoscopy for surveillance and for                         surveillance based on pathology results. Procedure Code(s):     --- Professional ---                        [phone], Colonoscopy, flexible; with removal of  tumor(s), polyp(s), or other lesion(s) by snare                         technique Diagnosis Code(s):     --- Professional ---                        K63.5, Polyp of colon                        Z12.11, Encounter for screening for malignant neoplasm                         of colon CPT copyright 2019 [employer] Association. All rights reserved. The codes documented in this report are preliminary and upon coder review may  be revised to meet current compliance requirements. Jonathon Bellows, MD Jonathon Bellows MD, MD 08/03/2019 8:44:43 AM This  report has been signed electronically. Number of Addenda: 0 Note Initiated On: 08/03/2019 7:30 AM Scope Withdrawal Time: 0 hours 24 minutes 33 seconds  Total Procedure Duration: 0 hours 29 minutes 3 seconds  Estimated Blood Loss:  Estimated blood loss: none.      [facility]

## 2019-08-03 NOTE — Anesthesia Postprocedure Evaluation (Signed)
Anesthesia Post Note  Patient: [name]  Procedure(s) Performed: COLONOSCOPY WITH PROPOFOL (N/A )  Patient location during evaluation: Endoscopy Anesthesia Type: General Level of consciousness: awake and alert Pain management: pain level controlled Vital Signs Assessment: post-procedure vital signs reviewed and stable Respiratory status: spontaneous breathing, nonlabored ventilation, respiratory function stable and patient connected to nasal cannula oxygen Cardiovascular status: blood pressure returned to baseline and stable Postop Assessment: no apparent nausea or vomiting Anesthetic complications: no     Last Vitals:  Vitals:   08/03/19 0856 08/03/19 0906  BP: 135/79 (!) 147/99  Pulse:    Resp:    Temp:    SpO2:      Last Pain:  Vitals:   08/03/19 0916  TempSrc:   PainSc: 0-No pain                 Precious Haws Halen Antenucci

## 2019-08-03 NOTE — Anesthesia Preprocedure Evaluation (Signed)
Anesthesia Evaluation  Patient identified by MRN, date of birth, ID band Patient awake    Reviewed: Allergy & Precautions, NPO status , Patient's Chart, lab work & pertinent test results  Airway Mallampati: II  TM Distance: >3 FB     Dental   Pulmonary sleep apnea ,    Pulmonary exam normal        Cardiovascular negative cardio ROS Normal cardiovascular exam     Neuro/Psych negative neurological ROS  negative psych ROS   GI/Hepatic Neg liver ROS, GERD  ,  Endo/Other  negative endocrine ROS  Renal/GU stones  negative genitourinary   Musculoskeletal negative musculoskeletal ROS (+)   Abdominal Normal abdominal exam  (+)   Peds negative pediatric ROS (+)  Hematology negative hematology ROS (+)   Anesthesia Other Findings   Reproductive/Obstetrics                             Anesthesia Physical Anesthesia Plan  ASA: II  Anesthesia Plan: General   Post-op Pain Management:    Induction: Intravenous  PONV Risk Score and Plan: TIVA  Airway Management Planned: Nasal Cannula  Additional Equipment:   Intra-op Plan:   Post-operative Plan:   Informed Consent: I have reviewed the patients History and Physical, chart, labs and discussed the procedure including the risks, benefits and alternatives for the proposed anesthesia with the patient or authorized representative who has indicated his/her understanding and acceptance.     Dental advisory given  Plan Discussed with: CRNA and Surgeon  Anesthesia Plan Comments:         Anesthesia Quick Evaluation

## 2019-08-03 NOTE — Transfer of Care (Signed)
Immediate Anesthesia Transfer of Care Note  Patient: OLAJUWON BEND  Procedure(s) Performed: COLONOSCOPY WITH PROPOFOL (N/A )  Patient Location: PACU  Anesthesia Type:General  Level of Consciousness: sedated  Airway & Oxygen Therapy: Patient Spontanous Breathing and Patient connected to nasal cannula oxygen  Post-op Assessment: Report given to RN and Post -op Vital signs reviewed and stable  Post vital signs: Reviewed and stable  Last Vitals:  Vitals Value Taken Time  BP 131/84 08/03/19 0846  Temp 36.1 C 08/03/19 0846  Pulse 73 08/03/19 0846  Resp 18 08/03/19 0846  SpO2 100 % 08/03/19 0846    Last Pain:  Vitals:   08/03/19 0846  TempSrc: Temporal  PainSc:          Complications: No apparent anesthesia complications

## 2019-08-03 NOTE — H&P (Signed)
Jonathon Bellows, MD [address] [place], [place], [place], [address] [id number], [place], [place], [place], [address] Phone: [phone]  Fax: [phone]  Primary Care Physician:  Delsa Grana, PA-C   Pre-Procedure History & Physical: HPI:  Jason Duncan is a 52 y.o. male is here for an colonoscopy.   Past Medical History:  Diagnosis Date  . Erectile dysfunction   . Erectile dysfunction 06/24/2017  . GERD (gastroesophageal reflux disease)   . History of kidney stones   . Kidney stones     Past Surgical History:  Procedure Laterality Date  . EXTERNAL EAR SURGERY Bilateral 1973  . LEG SURGERY Right 1988   rods placed due to broken leg  . SHOULDER SURGERY Right 2003    Prior to Admission medications   Medication Sig Start Date End Date Taking? Authorizing Provider  pantoprazole (PROTONIX) 40 MG tablet Take 1 tablet (40 mg total) by mouth daily as needed (acid reflux). 03/02/19  Yes Poulose, Bethel Born, NP  fluticasone (FLONASE) 50 MCG/ACT nasal spray Place 2 sprays into both nostrils daily. Patient not taking: Reported on 06/28/2019 03/16/19   Fredderick Severance, NP  sildenafil (REVATIO) 20 MG tablet TAKE ONE TO THREE TABLETS BY MOUTH DAILY AS NEEDED Patient not taking: Reported on 06/28/2019 06/18/19   Hubbard Hartshorn, FNP    Allergies as of 07/13/2019 - Review Complete 06/28/2019  Allergen Reaction Noted  . Sulfur  06/24/2017    Family History  Problem Relation Age of Onset  . Hypertension Mother   . Pneumonia Maternal Grandmother   . Cancer Maternal Grandfather        lung  . Heart disease Brother        51 years old, died from a heart attack    Social History   Socioeconomic History  . Marital status: Married    Spouse name: Sharyn Lull   . Number of children: 3  . Years of education: Not on file  . Highest education level: Some college, no degree  Occupational History  . Not on file  Social Needs  . Financial resource strain: Somewhat hard   . Food insecurity    Worry: Never true    Inability: Never true  . Transportation needs    Medical: No    Non-medical: No  Tobacco Use  . Smoking status: Never Smoker  . Smokeless tobacco: Current User    Types: Chew  Substance and Sexual Activity  . Alcohol use: No  . Drug use: No    Comment: used to smoke pot from [age] y old   . Sexual activity: Not Currently  Lifestyle  . Physical activity    Days per week: 1 day    Minutes per session: 20 min  . Stress: Only a little  Relationships  . Social Herbalist on phone: Twice a week    Gets together: Once a week    Attends religious service: More than 4 times per year    Active member of club or organization: No    Attends meetings of clubs or organizations: Never    Relationship status: Married  . Intimate partner violence    Fear of current or ex partner: No    Emotionally abused: No    Physically abused: No    Forced sexual activity: No  Other Topics Concern  . Not on file  Social History Narrative  . Not on file    Review of Systems:  See HPI, otherwise negative ROS  Physical Exam: BP (!) 142/95   Pulse (!) 52   Temp 97.7 F (36.5 C) (Temporal)   Resp 16   Ht 5\' 9"  (1.753 m)   Wt 79.4 kg   SpO2 100%   BMI 25.84 kg/m  General:   Alert,  pleasant and cooperative in NAD Head:  Normocephalic and atraumatic. Neck:  Supple; no masses or thyromegaly. Lungs:  Clear throughout to auscultation, normal respiratory effort.    Heart:  +S1, +S2, Regular rate and rhythm, No edema. Abdomen:  Soft, nontender and nondistended. Normal bowel sounds, without guarding, and without rebound.   Neurologic:  Alert and  oriented x4;  grossly normal neurologically.  Impression/Plan: Jason Duncan is here for an colonoscopy to be performed for Screening colonoscopy average risk   Risks, benefits, limitations, and alternatives regarding  colonoscopy have been reviewed with the patient.  Questions have been answered.   All parties agreeable.   Jonathon Bellows, MD  08/03/2019, 8:07 AM

## 2019-08-03 NOTE — Anesthesia Post-op Follow-up Note (Signed)
Anesthesia QCDR form completed.        

## 2019-08-04 ENCOUNTER — Encounter: Payer: Self-pay | Admitting: Gastroenterology

## 2019-08-04 LAB — SURGICAL PATHOLOGY

## 2019-08-08 ENCOUNTER — Encounter: Payer: Self-pay | Admitting: Gastroenterology

## 2019-09-03 ENCOUNTER — Other Ambulatory Visit: Payer: Self-pay

## 2019-09-03 ENCOUNTER — Telehealth: Payer: Self-pay | Admitting: Family Medicine

## 2019-09-03 MED ORDER — SILDENAFIL CITRATE 20 MG PO TABS: ORAL_TABLET | ORAL | 0 refills | Status: DC

## 2019-09-03 NOTE — Telephone Encounter (Signed)
Pt request refill   sildenafil (REVATIO) 20 MG tablet  [facility] [place], [place] Phone:  [phone]  Fax:  [phone]

## 2020-03-10 ENCOUNTER — Other Ambulatory Visit: Payer: Self-pay | Admitting: Family Medicine

## 2020-03-10 NOTE — Telephone Encounter (Signed)
Medication Refill - Medication: sildenafil   Has the patient contacted their pharmacy? Yes.   Pt states that he would like to have refill on file at the pharmacy if possible. Please advise.  (Agent: If no, request that the patient contact the pharmacy for the refill.) (Agent: If yes, when and what did the pharmacy advise?)  Preferred Pharmacy (with phone number or street name):  [place], [place] - [facility]  [address] [place] [address]  Phone: [phone] Fax: [phone]  Hours: Not open 24 hours     Agent: Please be advised that RX refills may take up to 3 business days. We ask that you follow-up with your pharmacy.

## 2020-03-13 MED ORDER — SILDENAFIL CITRATE 20 MG PO TABS: ORAL_TABLET | ORAL | 0 refills | Status: DC

## 2020-03-13 NOTE — Telephone Encounter (Signed)
Advised Pt he needed an appt/ Pt wants to know why he needs an appt to get a refill for sildenafil (REVATIO) 20 MG tablet  / please call and advise

## 2020-03-13 NOTE — Telephone Encounter (Signed)
Please advise. Looking at his notes from October visit he was told to follow up in a yr for a cpe and follow up.

## 2020-03-13 NOTE — Addendum Note (Signed)
Addended by: Docia Furl on: 03/13/2020 11:46 AM   Modules accepted: Orders

## 2020-03-14 ENCOUNTER — Ambulatory Visit: Payer: Self-pay | Admitting: *Deleted

## 2020-03-14 NOTE — Telephone Encounter (Signed)
Pt called with complaints of right groin pain that started 03/13/20; his right leg "locks up where hip joins thigh; and my leg locks up sometimes"; the pt originally thought it was kidney stones but his leg has never been involved; he is using a crutch to walk; the pt states he can't lay on either side; he rates is pain at at least an 8 out of 10; the pt says he has taken ibuprofen for pain; recommendations made per nurse triage protocol; he verbalized understanding and will go to urgent care; he sees Delsa Grana, [facility]; will route to office for notification.  Reason for Disposition . [1] SEVERE pain (e.g., excruciating, unable to do any normal activities) AND [2] not improved after 2 hours of pain medicine  Answer Assessment - Initial Assessment Questions 1. ONSET: "When did the pain start?"      03/13/20 around "lunchtime" 2. LOCATION: "Where is the pain located?"     Right groin and leg 3. PAIN: "How bad is the pain?"    (Scale 1-10; or mild, moderate, severe)   -  MILD (1-3): doesn't interfere with normal activities    -  MODERATE (4-7): interferes with normal activities (e.g., work or school) or awakens from sleep, limping    -  SEVERE (8-10): excruciating pain, unable to do any normal activities, unable to walk    At least 8 out of 10 4. 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 CAUSE: "What do you think is causing the leg pain?"    Originally thought pain was due to kidney stones 6. OTHER SYMPTOMS: "Do you have any other symptoms?" (e.g., chest pain, back pain, breathing difficulty, swelling, rash, fever, numbness, weakness)     "right leg locks up" 7. PREGNANCY: "Is there any chance you are pregnant?" "When was your last menstrual period?"     n/a  Protocols used: LEG PAIN-A-AH

## 2020-07-18 ENCOUNTER — Other Ambulatory Visit: Payer: Self-pay

## 2020-07-18 ENCOUNTER — Ambulatory Visit (INDEPENDENT_AMBULATORY_CARE_PROVIDER_SITE_OTHER): Payer: Commercial Managed Care - PPO | Admitting: Family Medicine

## 2020-07-18 ENCOUNTER — Encounter: Payer: Commercial Managed Care - PPO | Admitting: Family Medicine

## 2020-07-18 ENCOUNTER — Encounter: Payer: Self-pay | Admitting: Family Medicine

## 2020-07-18 VITALS — BP 118/78 | HR 71 | Temp 98.2°F | Resp 18 | Ht 68.0 in | Wt 197.3 lb

## 2020-07-18 DIAGNOSIS — N529 Male erectile dysfunction, unspecified: Secondary | ICD-10-CM

## 2020-07-18 DIAGNOSIS — G4733 Obstructive sleep apnea (adult) (pediatric): Secondary | ICD-10-CM

## 2020-07-18 DIAGNOSIS — Z720 Tobacco use: Secondary | ICD-10-CM

## 2020-07-18 DIAGNOSIS — E669 Obesity, unspecified: Secondary | ICD-10-CM

## 2020-07-18 DIAGNOSIS — G629 Polyneuropathy, unspecified: Secondary | ICD-10-CM

## 2020-07-18 DIAGNOSIS — Z6830 Body mass index (BMI) 30.0-30.9, adult: Secondary | ICD-10-CM

## 2020-07-18 DIAGNOSIS — E785 Hyperlipidemia, unspecified: Secondary | ICD-10-CM

## 2020-07-18 DIAGNOSIS — J324 Chronic pansinusitis: Secondary | ICD-10-CM | POA: Diagnosis not present

## 2020-07-18 DIAGNOSIS — Z0000 Encounter for general adult medical examination without abnormal findings: Secondary | ICD-10-CM

## 2020-07-18 DIAGNOSIS — G4700 Insomnia, unspecified: Secondary | ICD-10-CM

## 2020-07-18 DIAGNOSIS — Z1159 Encounter for screening for other viral diseases: Secondary | ICD-10-CM | POA: Diagnosis not present

## 2020-07-18 DIAGNOSIS — R202 Paresthesia of skin: Secondary | ICD-10-CM

## 2020-07-18 MED ORDER — TADALAFIL 20 MG PO TABS: 10.0000 mg | ORAL_TABLET | ORAL | 11 refills | Status: DC | PRN

## 2020-07-18 MED ORDER — AMOXICILLIN-POT CLAVULANATE 875-125 MG PO TABS: 1.0000 | ORAL_TABLET | Freq: Two times a day (BID) | ORAL | 0 refills | Status: AC

## 2020-07-18 NOTE — Progress Notes (Signed)
Patient: Jason Duncan, Male    DOB: [birth year], 54 y.o.   MRN: [id number] Delsa Grana, PA-C Visit Date: 07/18/2020  Today's Provider: Delsa Grana, PA-C   Chief Complaint  Patient presents with  . Annual Exam   Subjective:   Annual physical exam:  Jason Duncan is a 54 y.o. male who presents today for health maintenance and annual & complete physical exam.  He feels well.  He reports exercising:   less active then last year Diet:  Not cutting out sweets anymore, eating fried foods, he decreased the amount of fruits and veggies  Insomnia, chronic sinusitis - no change since last year, currently symptomatic- not on meds, wants referral to ENT in [place], does not want to go to [facility] He has HA freq, congestion, pain, cannot lay down due to sinus congestion, gets morning HA's.  Abx help, but OTC meds do not help.  Last Abx was through [facility] through his health insurance, he believes that was in June or July.  He does feel like he needs them now. Prior HPI at physical one year ago- he was referred to ENT then but refused: He's on zyrtec D and temporarily used flonase - it helped for a little bit but then it made sx worse.   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  He uses medications daily has sinus headaches around his eyes and in both of his cheeks it seems alternate sides.  He has a lot of burning postnasal drip and scratchy sore throat.  He reports he is sleeping poorly - still poor sleep -dx with OSA two years ago, still cannot use CPAP  ED- takes 20 mg sildenafil - still does not work for him, mostly give HA and no erection, would like to try cialis again, worked in the past  Paperwork for employer - filled out today  USPSTF grade A and B recommendations - reviewed and addressed  today  Depression:  Phq 9 completed today by patient, was reviewed by me with patient in the room, score is neg, pt feels good overall for mood PHQ 2/9 Scores 07/18/2020 03/16/2019 03/02/2019 08/10/2018  PHQ - 2 Score 2 0 0 0  PHQ- 9 Score - 0 0 0   Depression screen [facility] 2/9 07/18/2020 03/16/2019 03/02/2019 08/10/2018 07/09/2018  Decreased Interest 1 0 0 0 0  Down, Depressed, Hopeless 1 0 0 0 0  PHQ - 2 Score 2 0 0 0 0  Altered sleeping - 0 0 0 -  Tired, decreased energy - 0 0 0 -  Change in appetite - 0 0 0 -  Feeling bad or failure about yourself  - 0 0 0 -  Trouble concentrating - 0 0 0 -  Moving slowly or fidgety/restless - 0 0 0 -  Suicidal thoughts - 0 0 0 -  PHQ-9 Score - 0 0 0 -  Difficult doing work/chores - Not difficult at all Not difficult at all Not difficult at all -    Hep C Screening: due  STD testing and prevention (HIV/chl/gon/syphilis): deferred -declines HIV screening and states has been in a monogamous relationship with his wife does not want any other STD testing either   Prostate cancer: PSA lab done last year, has some  urinary sx, no change from last year.  Has never been to urology.  Not worried about it right now Prostate cancer screening with PSA: Discussed risks and benefits of PSA testing and provided handout. Pt would not like to have PSA drawn today. Lab Results  Component Value Date   PSA 1.0 06/28/2019   PSA 1.2 07/09/2018   Intimate partner violence:  None, denies Urinary Symptoms:  IPSS Questionnaire (AUA-7):  IPSS    Row Name 07/18/20 0853         International Prostate Symptom Score   How often have you had the sensation of not emptying your bladder? Not at All     How often have you had to urinate less than every two hours? Less than half the time     How often have you found you stopped and started again several times when you urinated? Less than 1 in 5 times     How often have you found it difficult to postpone urination? Less than half  the time     How often have you had a weak urinary stream? Less than half the time     How often have you had to strain to start urination? Not at All     How many times did you typically get up at night to urinate? 2 Times     Total IPSS Score 9             Advanced Care Planning:  A voluntary discussion about advance care planning including the explanation and discussion of advance directives.  Discussed health care proxy and Living will, and the patient was able to identify a health care proxy as wife - Jason Duncan.  Patient does not have a living will at present time. If patient does have living will, I have requested they bring this to the clinic to be scanned in to their chart. Reviewed today, does not want a packet to fill out.  Colorectal cancer:  Done last year, 3 year f/up  Lung cancer:   Low Dose CT Chest recommended if Age 49-80 years, 30 pack-year currently smoking OR have quit w/in 15years. Patient does not qualify.   Social History   Tobacco Use  . Smoking status: Never Smoker  . Smokeless tobacco: Current User    Types: Chew  Substance Use Topics  . Alcohol use: No     Alcohol screening:   [facility] Visit from 07/18/2020 in [facility]  AUDIT-C Score 0     AAA:  The USPSTF recommends one-time screening with ultrasonography in men ages 76 to 54 years who have ever smoked   Blood pressure/Hypertension: BP Readings from Last 3 Encounters:  07/18/20 118/78  08/03/19 (!) 147/99  06/28/19 124/80   Weight/Obesity: Wt Readings from Last 3 Encounters:  07/18/20 197 lb 4.8 oz (89.5 kg)  08/03/19 175 lb (79.4 kg)  06/28/19 177 lb (80.3 kg)   BMI Readings from Last 3 Encounters:  07/18/20 30.00 kg/m  08/03/19 25.84 kg/m  06/28/19 26.91 kg/m    Lipids:  Lab Results  Component Value Date   CHOL 138 06/28/2019   CHOL 178 07/09/2018   CHOL 162 06/24/2017   Lab Results  Component Value Date   HDL 36 (L) 06/28/2019   HDL 37 (L)  07/09/2018   HDL 39 (L) 06/24/2017   Lab Results  Component Value Date   LDLCALC 82 06/28/2019   LDLCALC 119 (H) 07/09/2018   LDLCALC 100 (H) 06/24/2017  Lab Results  Component Value Date   TRIG 108 06/28/2019   TRIG 113 07/09/2018   TRIG 132 06/24/2017   Lab Results  Component Value Date   CHOLHDL 3.8 06/28/2019   CHOLHDL 4.8 07/09/2018   CHOLHDL 4.2 06/24/2017   No results found for: LDLDIRECT Based on the results of lipid panel his/her cardiovascular risk factor ( using [facility] )  in the next 10 years is : The 10-year ASCVD risk score Mikey Bussing DC Brooke Bonito., et al., 2013) is: 3.1%   Values used to calculate the score:     Age: 82 years     Sex: Male     Is Non-Hispanic African American: No     Diabetic: No     Tobacco smoker: No     Systolic Blood Pressure: 109 mmHg     Is BP treated: No     HDL Cholesterol: 36 mg/dL     Total Cholesterol: 138 mg/dL Glucose:  Glucose  Date Value Ref Range Status  07/27/2012 100 (H) 65 - 99 mg/dL Final   Glucose, Bld  Date Value Ref Range Status  06/28/2019 108 (H) 65 - 99 mg/dL Final    Comment:    .            Fasting reference interval . For someone without known diabetes, a glucose value between 100 and 125 mg/dL is consistent with prediabetes and should be confirmed with a follow-up test. .   02/27/2019 103 (H) 70 - 99 mg/dL Final  07/09/2018 90 65 - 99 mg/dL Final    Comment:    .            Fasting reference interval .     Social History      He  reports that he has never smoked. His smokeless tobacco use includes chew. He reports that he does not drink alcohol and does not use drugs.              Social History   Socioeconomic History  . Marital status: Married    Spouse name: Sharyn Lull   . Number of children: 3  . Years of education: Not on file  . Highest education level: Some college, no degree  Occupational History  . Not on file  Tobacco Use  . Smoking status: Never Smoker  . Smokeless tobacco:  Current User    Types: Chew  Vaping Use  . Vaping Use: Never used  Substance and Sexual Activity  . Alcohol use: No  . Drug use: No    Comment: used to smoke pot from [age] y old   . Sexual activity: Not Currently  Other Topics Concern  . Not on file  Social History Narrative  . Not on file   Social Determinants of Health   Financial Resource Strain: Low Risk   . Difficulty of Paying Living Expenses: Not hard at all  Food Insecurity: No Food Insecurity  . Worried About Charity fundraiser in the Last Year: Never true  . Ran Out of Food in the Last Year: Never true  Transportation Needs: No Transportation Needs  . Lack of Transportation (Medical): No  . Lack of Transportation (Non-Medical): No  Physical Activity: Insufficiently Active  . Days of Exercise per Week: 1 day  . Minutes of Exercise per Session: 20 min  Stress: Stress Concern Present  . Feeling of Stress : Rather much  Social Connections: Moderately Integrated  . Frequency of Communication with Friends and Family:  More than three times a week  . Frequency of Social Gatherings with Friends and Family: Once a week  . Attends Religious Services: More than 4 times per year  . Active Member of Clubs or Organizations: No  . Attends Archivist Meetings: Never  . Marital Status: Married  Human resources officer Violence: Not At Risk  . Fear of Current or Ex-Partner: No  . Emotionally Abused: No  . Physically Abused: No  . Sexually Abused: No    Family History        Family Status  Relation Name Status  . Mother  Alive  . Father  Alive  . Sister 1/2 Alive  . Son  Alive  . MGM  Deceased       fall, pneumonia  . MGF  Deceased       lung cancer  . PGM  Deceased       unknown  . PGF  Deceased       unknown  . Sister 1/2 Alive  . Son  Alive  . Son  Alive  . Brother  Deceased        His family history includes Cancer in his maternal grandfather; Heart disease in his brother; Hypertension in his mother;  Pneumonia in his maternal grandmother.       Family History  Problem Relation Age of Onset  . Hypertension Mother   . Pneumonia Maternal Grandmother   . Cancer Maternal Grandfather        lung  . Heart disease Brother        32 years old, died from a heart attack    Patient Active Problem List   Diagnosis Date Noted  . Insomnia 06/28/2019  . Tobacco chew use 06/28/2019  . OSA (obstructive sleep apnea) 06/28/2019  . Chronic pansinusitis 06/28/2019  . Hiatal hernia with GERD 03/02/2019  . Erectile dysfunction 06/24/2017  . Vitamin D deficiency 06/24/2017    Past Surgical History:  Procedure Laterality Date  . COLONOSCOPY WITH PROPOFOL N/A 08/03/2019   Procedure: COLONOSCOPY WITH PROPOFOL;  Surgeon: Jonathon Bellows, MD;  Location: [facility] ENDOSCOPY;  Service: Gastroenterology;  Laterality: N/A;  . EXTERNAL EAR SURGERY Bilateral 1973  . LEG SURGERY Right 1988   rods placed due to broken leg  . SHOULDER SURGERY Right 2003     Current Outpatient Medications:  .  amoxicillin-clavulanate (AUGMENTIN) 875-125 MG tablet, Take 1 tablet by mouth 2 (two) times daily for 10 days., Disp: 20 tablet, Rfl: 0 .  tadalafil (CIALIS) 20 MG tablet, Take 0.5-1 tablets (10-20 mg total) by mouth every other day as needed for erectile dysfunction., Disp: 10 tablet, Rfl: 11  Allergies  Allergen Reactions  . Sulfur     Patient Care Team: Delsa Grana, PA-C as PCP - General (Family Medicine)  I personally reviewed active problem list, medication list, allergies, family history, social history, health maintenance, notes from last encounter, lab results, imaging with the patient/caregiver today.  Review of Systems  10 Systems reviewed and are negative for acute change except as noted in the HPI.       Objective:   Vitals:  Vitals:   07/18/20 0821  BP: 118/78  Pulse: 71  Resp: 18  Temp: 98.2 F (36.8 C)  TempSrc: Oral  SpO2: 94%  Weight: 197 lb 4.8 oz (89.5 kg)  Height: 5\' 8"  (1.727 m)      Body mass index is 30 kg/m.  Physical Exam Vitals and nursing note reviewed.  Constitutional:  General: He is not in acute distress.    Appearance: Normal appearance. He is well-developed. He is obese. He is not ill-appearing, toxic-appearing or diaphoretic.     Comments: Appears tired and older than stated age  HENT:     Head: Normocephalic and atraumatic.     Jaw: No trismus.     Right Ear: Tympanic membrane, ear canal and external ear normal. There is no impacted cerumen.     Left Ear: Tympanic membrane, ear canal and external ear normal. There is no impacted cerumen.     Nose: Septal deviation, mucosal edema, congestion and rhinorrhea present.     Right Turbinates: Enlarged and swollen.     Left Turbinates: Enlarged and swollen.     Right Sinus: Maxillary sinus tenderness and frontal sinus tenderness present.     Left Sinus: Maxillary sinus tenderness and frontal sinus tenderness present.     Comments: Nasal mucosa diffusely edematous, erythematous and copious nasal discharge    Mouth/Throat:     Mouth: Mucous membranes are moist.     Pharynx: Oropharynx is clear. Uvula midline. Posterior oropharyngeal erythema present. No oropharyngeal exudate or uvula swelling.     Tonsils: No tonsillar exudate. 0 on the right. 0 on the left.  Eyes:     General: Lids are normal. No scleral icterus.       Right eye: No discharge.        Left eye: No discharge.     Conjunctiva/sclera: Conjunctivae normal.  Neck:     Trachea: Trachea and phonation normal. No tracheal deviation.  Cardiovascular:     Rate and Rhythm: Normal rate and regular rhythm.     Pulses: Normal pulses.          Radial pulses are 2+ on the right side and 2+ on the left side.     Heart sounds: Normal heart sounds. No murmur heard.  No friction rub. No gallop.   Pulmonary:     Effort: Pulmonary effort is normal.     Breath sounds: Normal breath sounds. No wheezing, rhonchi or rales.  Abdominal:     General: Bowel  sounds are normal. There is no distension.     Palpations: Abdomen is soft.     Tenderness: There is no abdominal tenderness. There is no guarding or rebound.  Musculoskeletal:     Cervical back: Normal range of motion.     Right lower leg: No edema.     Left lower leg: No edema.  Skin:    General: Skin is warm and dry.     Coloration: Skin is not jaundiced or pale.     Findings: No rash.  Neurological:     Mental Status: He is alert.     Gait: Gait normal.  Psychiatric:        Mood and Affect: Mood normal.        Speech: Speech normal.        Behavior: Behavior normal.    PHQ2/9: Depression screen [facility] 2/9 07/18/2020 03/16/2019 03/02/2019 08/10/2018 07/09/2018  Decreased Interest 1 0 0 0 0  Down, Depressed, Hopeless 1 0 0 0 0  PHQ - 2 Score 2 0 0 0 0  Altered sleeping - 0 0 0 -  Tired, decreased energy - 0 0 0 -  Change in appetite - 0 0 0 -  Feeling bad or failure about yourself  - 0 0 0 -  Trouble concentrating - 0 0 0 -  Moving slowly or  fidgety/restless - 0 0 0 -  Suicidal thoughts - 0 0 0 -  PHQ-9 Score - 0 0 0 -  Difficult doing work/chores - Not difficult at all Not difficult at all Not difficult at all -    Fall Risk: Fall Risk  07/18/2020 03/16/2019 03/02/2019 08/10/2018 07/09/2018  Falls in the past year? 0 0 0 0 No  Number falls in past yr: 0 0 0 0 -  Injury with Fall? 0 0 - - -  Follow up Falls evaluation completed - - - -     Assessment & Plan:    CPE completed today  . Prostate cancer screening and PSA options (with potential risks and benefits of testing vs not testing) were discussed along with recent recs/guidelines, shared decision making and handout/information given to pt today  . USPSTF grade A and B recommendations reviewed with patient; age-appropriate recommendations, preventive care, screening tests, etc discussed and encouraged; healthy living encouraged; see AVS for patient education given to patient  . Discussed importance of 150 minutes of  physical activity weekly, AHA exercise recommendations given to pt in AVS/handout  . Discussed importance of healthy diet:  eating lean meats and proteins, avoiding trans fats and saturated fats, avoid simple sugars and excessive carbs in diet, eat 6 servings of fruit/vegetables daily and drink plenty of water and avoid sweet beverages.   . Recommended pt to do annual eye exam and routine dental exams/cleanings  . Reviewed Health Maintenance: Health Maintenance  Topic Date Due  . Hepatitis C Screening  Never done  . HIV Screening  Never done  . COLONOSCOPY  08/02/2022  . TETANUS/TDAP  09/16/2024  . INFLUENZA VACCINE  Completed  . COVID-19 Vaccine  Completed    . Immunizations: Immunization History  Administered Date(s) Administered  . Influenza-Unspecified 06/18/2019, 07/06/2020  . Moderna SARS-COVID-2 Vaccination 09/27/2019, 10/25/2019       ICD-10-CM   1. Adult general medical exam  Z00.00 CBC with Differential/Platelet    COMPLETE METABOLIC PANEL WITH GFR    Lipid panel  2. Encounter for hepatitis C screening test for low risk patient  Z11.59 Hepatitis C antibody  3. Chronic pansinusitis  J32.4 amoxicillin-clavulanate (AUGMENTIN) 875-125 MG tablet    Ambulatory referral to ENT   told needs surgery in the past, on meds constantly, frequent infections and sinus HA's to around eyes and maxillary sinus  4. OSA (obstructive sleep apnea)  G47.33    unable to use CPAP, did not f/up, continues to deal with poor sleep, snoring, morning HA's  5. Erectile dysfunction, unspecified erectile dysfunction type  N52.9 tadalafil (CIALIS) 20 MG tablet   he would like to try different meds- cialis rx for him, he's tried in the past  6. Insomnia, unspecified type  G47.00    continued insomnia - dealing with chronic sinus issues and OSA will likely help  7. Hyperlipidemia, unspecified hyperlipidemia type  E78.5    elevated cholesterol in the past, improved with diet efforts - which he has stopped  working on, gained weight, recheck lipids  8. Class 1 obesity with body mass index (BMI) of 30.0 to 30.9 in adult, unspecified obesity type, unspecified whether serious comorbidity present  E66.9    Z68.30    encouraged him to work on healthy diet and exercise again  9. Paresthesias  R20.2    to bilateral feet and lower legs, pt complained of pain but then when I explained what we could do to evaluated it, he declined  10.  Neuropathy  G62.9    to feet and legs worse at nighttime  11. Tobacco chew use  Z72.0     Signed forms for his employer stating that he has completed his physical this year   Delsa Grana, Hershal Coria 07/18/20 12:17 PM  [place]

## 2020-07-18 NOTE — Patient Instructions (Signed)
Preventive Care 40-53 Years Old, Male °Preventive care refers to lifestyle choices and visits with your health care provider that can promote health and wellness. This includes: °· A yearly physical exam. This is also called an annual well check. °· Regular dental and eye exams. °· Immunizations. °· Screening for certain conditions. °· Healthy lifestyle choices, such as eating a healthy diet, getting regular exercise, not using drugs or products that contain nicotine and tobacco, and limiting alcohol use. °What can I expect for my preventive care visit? °Physical exam °Your health care provider will check: °· Height and weight. These may be used to calculate body mass index (BMI), which is a measurement that tells if you are at a healthy weight. °· Heart rate and blood pressure. °· Your skin for abnormal spots. °Counseling °Your health care provider may ask you questions about: °· Alcohol, tobacco, and drug use. °· Emotional well-being. °· Home and relationship well-being. °· Sexual activity. °· Eating habits. °· Work and work environment. °What immunizations do I need? ° °Influenza (flu) vaccine °· This is recommended every year. °Tetanus, diphtheria, and pertussis (Tdap) vaccine °· You may need a Td booster every 10 years. °Varicella (chickenpox) vaccine °· You may need this vaccine if you have not already been vaccinated. °Zoster (shingles) vaccine °· You may need this after age 60. °Measles, mumps, and rubella (MMR) vaccine °· You may need at least one dose of MMR if you were born in 1957 or later. You may also need a second dose. °Pneumococcal conjugate (PCV13) vaccine °· You may need this if you have certain conditions and were not previously vaccinated. °Pneumococcal polysaccharide (PPSV23) vaccine °· You may need one or two doses if you smoke cigarettes or if you have certain conditions. °Meningococcal conjugate (MenACWY) vaccine °· You may need this if you have certain conditions. °Hepatitis A  vaccine °· You may need this if you have certain conditions or if you travel or work in places where you may be exposed to hepatitis A. °Hepatitis B vaccine °· You may need this if you have certain conditions or if you travel or work in places where you may be exposed to hepatitis B. °Haemophilus influenzae type b (Hib) vaccine °· You may need this if you have certain risk factors. °Human papillomavirus (HPV) vaccine °· If recommended by your health care provider, you may need three doses over 6 months. °You may receive vaccines as individual doses or as more than one vaccine together in one shot (combination vaccines). Talk with your health care provider about the risks and benefits of combination vaccines. °What tests do I need? °Blood tests °· Lipid and cholesterol levels. These may be checked every 5 years, or more frequently if you are over 50 years old. °· Hepatitis C test. °· Hepatitis B test. °Screening °· Lung cancer screening. You may have this screening every year starting at age 55 if you have a 30-pack-year history of smoking and currently smoke or have quit within the past 15 years. °· Prostate cancer screening. Recommendations will vary depending on your family history and other risks. °· Colorectal cancer screening. All adults should have this screening starting at age 50 and continuing until age 75. Your health care provider may recommend screening at age 45 if you are at increased risk. You will have tests every 1-10 years, depending on your results and the type of screening test. °· Diabetes screening. This is done by checking your blood sugar (glucose) after you have not eaten   for a while (fasting). You may have this done every 1-3 years.  Sexually transmitted disease (STD) testing. Follow these instructions at home: Eating and drinking  Eat a diet that includes fresh fruits and vegetables, whole grains, lean protein, and low-fat dairy products.  Take vitamin and mineral supplements as  recommended by your health care provider.  Do not drink alcohol if your health care provider tells you not to drink.  If you drink alcohol: ? Limit how much you have to 0-2 drinks a day. ? Be aware of how much alcohol is in your drink. In the [place] one drink equals one 12 oz bottle of beer (355 mL), one 5 oz glass of wine (148 mL), or one 1 oz glass of hard liquor (44 mL). Lifestyle  Take daily care of your teeth and gums.  Stay active. Exercise for at least 30 minutes on 5 or more days each week.  Do not use any products that contain nicotine or tobacco, such as cigarettes, e-cigarettes, and chewing tobacco. If you need help quitting, ask your health care provider.  If you are sexually active, practice safe sex. Use a condom or other form of protection to prevent STIs (sexually transmitted infections).  Talk with your health care provider about taking a low-dose aspirin every day starting at age 78. What's next?  Go to your health care provider once a year for a well check visit.  Ask your health care provider how often you should have your eyes and teeth checked.  Stay up to date on all vaccines. This information is not intended to replace advice given to you by your health care provider. Make sure you discuss any questions you have with your health care provider. Document Revised: 08/27/2018 Document Reviewed: 08/27/2018 Elsevier Patient Education  2020 [employer].

## 2020-07-19 LAB — LIPID PANEL
Cholesterol: 148 mg/dL (ref <200)
HDL: 35 mg/dL — ABNORMAL LOW (ref >=40)
LDL Cholesterol (Calc): 94 mg/dL (calc)
Non-HDL Cholesterol (Calc): 113 mg/dL (calc) (ref <130)
Total CHOL/HDL Ratio: 4.2 (calc) (ref <5.0)
Triglycerides: 91 mg/dL (ref <150)

## 2020-07-19 LAB — COMPLETE METABOLIC PANEL WITH GFR
AG Ratio: 2.1 (calc) (ref 1.0–2.5)
ALT: 16 U/L (ref 9–46)
AST: 16 U/L (ref 10–35)
Albumin: 4.8 g/dL (ref 3.6–5.1)
Alkaline phosphatase (APISO): 61 U/L (ref 35–144)
BUN: 16 mg/dL (ref 7–25)
CO2: 28 mmol/L (ref 20–32)
Calcium: 10.1 mg/dL (ref 8.6–10.3)
Chloride: 106 mmol/L (ref 98–110)
Creat: 0.9 mg/dL (ref 0.70–1.33)
GFR, Est African American: 113 mL/min/{1.73_m2} (ref >=60)
GFR, Est Non African American: 98 mL/min/{1.73_m2} (ref >=60)
Globulin: 2.3 g/dL (calc) (ref 1.9–3.7)
Glucose, Bld: 92 mg/dL (ref 65–99)
Potassium: 4.5 mmol/L (ref 3.5–5.3)
Sodium: 143 mmol/L (ref 135–146)
Total Bilirubin: 0.4 mg/dL (ref 0.2–1.2)
Total Protein: 7.1 g/dL (ref 6.1–8.1)

## 2020-07-19 LAB — HEPATITIS C ANTIBODY
Hepatitis C Ab: NONREACTIVE
SIGNAL TO CUT-OFF: 0.02 (ref <1.00)

## 2020-07-19 LAB — CBC WITH DIFFERENTIAL/PLATELET
Absolute Monocytes: 308 cells/uL (ref 200–950)
Basophils Absolute: 28 cells/uL (ref 0–200)
Basophils Relative: 0.7 %
Eosinophils Absolute: 8 cells/uL — ABNORMAL LOW (ref 15–500)
Eosinophils Relative: 0.2 %
HCT: 46.7 % (ref 38.5–50.0)
Hemoglobin: 15.8 g/dL (ref 13.2–17.1)
Lymphs Abs: 1384 cells/uL (ref 850–3900)
MCH: 30.7 pg (ref 27.0–33.0)
MCHC: 33.8 g/dL (ref 32.0–36.0)
MCV: 90.9 fL (ref 80.0–100.0)
MPV: 9.1 fL (ref 7.5–12.5)
Monocytes Relative: 7.7 %
Neutro Abs: 2272 cells/uL (ref 1500–7800)
Neutrophils Relative %: 56.8 %
Platelets: 315 10*3/uL (ref 140–400)
RBC: 5.14 10*6/uL (ref 4.20–5.80)
RDW: 12.5 % (ref 11.0–15.0)
Total Lymphocyte: 34.6 %
WBC: 4 10*3/uL (ref 3.8–10.8)

## 2020-09-07 ENCOUNTER — Encounter (INDEPENDENT_AMBULATORY_CARE_PROVIDER_SITE_OTHER): Payer: Self-pay | Admitting: Otolaryngology

## 2020-09-07 ENCOUNTER — Other Ambulatory Visit: Payer: Self-pay

## 2020-09-07 ENCOUNTER — Ambulatory Visit (INDEPENDENT_AMBULATORY_CARE_PROVIDER_SITE_OTHER): Payer: Commercial Managed Care - PPO | Admitting: Otolaryngology

## 2020-09-07 VITALS — Temp 97.9°F

## 2020-09-07 DIAGNOSIS — J329 Chronic sinusitis, unspecified: Secondary | ICD-10-CM

## 2020-09-07 DIAGNOSIS — J342 Deviated nasal septum: Secondary | ICD-10-CM

## 2020-09-07 DIAGNOSIS — J310 Chronic rhinitis: Secondary | ICD-10-CM | POA: Diagnosis not present

## 2020-09-07 NOTE — Progress Notes (Signed)
HPI: Jason Duncan is a 54 y.o. male who presents is referred by Delsa Grana PA-C for evaluation of chronic "sinus" complaints.  Patient has history of obstructive sleep apnea that was diagnosed several years ago but does not wear CPAP machine as he does not tolerate it.  He apparently gets for 5 infections yearly.  When he has infections he describes a lot of pressure between his eyes with thick postnasal drainage.  He has moderate nasal congestion.  He has been prescribed Flonase in the past but does not use this regularly as he states that sometimes it makes his symptoms worse. He relates that he has had sinus problems for over 10 years.  When he takes medication for sinus problems he states that Advil Cold and Sinus seems to work the best sometimes he uses Tylenol for the head pressure.  Past Medical History:  Diagnosis Date  . Erectile dysfunction   . Erectile dysfunction 06/24/2017  . GERD (gastroesophageal reflux disease)   . History of kidney stones   . Kidney stones    Past Surgical History:  Procedure Laterality Date  . COLONOSCOPY WITH PROPOFOL N/A 08/03/2019   Procedure: COLONOSCOPY WITH PROPOFOL;  Surgeon: Jonathon Bellows, MD;  Location: [facility] ENDOSCOPY;  Service: Gastroenterology;  Laterality: N/A;  . EXTERNAL EAR SURGERY Bilateral 1973  . LEG SURGERY Right 1988   rods placed due to broken leg  . SHOULDER SURGERY Right 2003   Social History   Socioeconomic History  . Marital status: Married    Spouse name: Sharyn Lull   . Number of children: 3  . Years of education: Not on file  . Highest education level: Some college, no degree  Occupational History  . Not on file  Tobacco Use  . Smoking status: Never Smoker  . Smokeless tobacco: Current User    Types: Chew  Vaping Use  . Vaping Use: Never used  Substance and Sexual Activity  . Alcohol use: No  . Drug use: No    Comment: used to smoke pot from [age] y old   . Sexual activity: Not Currently  Other Topics Concern  .  Not on file  Social History Narrative  . Not on file   Social Determinants of Health   Financial Resource Strain: Low Risk   . Difficulty of Paying Living Expenses: Not hard at all  Food Insecurity: No Food Insecurity  . Worried About Charity fundraiser in the Last Year: Never true  . Ran Out of Food in the Last Year: Never true  Transportation Needs: No Transportation Needs  . Lack of Transportation (Medical): No  . Lack of Transportation (Non-Medical): No  Physical Activity: Insufficiently Active  . Days of Exercise per Week: 1 day  . Minutes of Exercise per Session: 20 min  Stress: Stress Concern Present  . Feeling of Stress : Rather much  Social Connections: Moderately Integrated  . Frequency of Communication with Friends and Family: More than three times a week  . Frequency of Social Gatherings with Friends and Family: Once a week  . Attends Religious Services: More than 4 times per year  . Active Member of Clubs or Organizations: No  . Attends Archivist Meetings: Never  . Marital Status: Married   Family History  Problem Relation Age of Onset  . Hypertension Mother   . Pneumonia Maternal Grandmother   . Cancer Maternal Grandfather        lung  . Heart disease Brother  38 years old, died from a heart attack   Allergies  Allergen Reactions  . Sulfur    Prior to Admission medications   Medication Sig Start Date End Date Taking? Authorizing Provider  tadalafil (CIALIS) 20 MG tablet Take 0.5-1 tablets (10-20 mg total) by mouth every other day as needed for erectile dysfunction. 07/18/20   Delsa Grana, PA-C     Positive ROS: Otherwise negative  All other systems have been reviewed and were otherwise negative with the exception of those mentioned in the HPI and as above.  Physical Exam: Constitutional: Alert, well-appearing, no acute distress Ears: External ears without lesions or tenderness. Ear canals are clear bilaterally with intact, clear TMs  bilaterally. Nasal: External nose without lesions. Septum is moderately severely deviated to the right side making visualization of the middle turbinate and middle meatus on the right side very difficult.  The left middle meatus region was clear with no obvious mucopurulent discharge noted no polyps noted.  Nasopharyngoscopy was performed on nasopharyngoscopy the nasopharynx was clear with no obvious mucopurulent discharge noted..  Oral: Lips and gums without lesions. Tongue and palate mucosa without lesions. Posterior oropharynx clear.  He is status post tonsillectomy. Neck: No palpable adenopathy or masses Respiratory: Breathing comfortably  Skin: No facial/neck lesions or rash noted.  Procedures  Assessment: Chronic rhinitis with questionable sinus disease or obstruction. 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 Obstructive sleep apnea  Plan: Prescribed Nasacort 2 sprays each nostril at night and recommended using this regularly over the next couple weeks. We will schedule a CT scan to better evaluate the sinuses and have him follow-up following the CT scan.   Radene Journey, MD   CC:

## 2020-09-12 ENCOUNTER — Other Ambulatory Visit (INDEPENDENT_AMBULATORY_CARE_PROVIDER_SITE_OTHER): Payer: Self-pay

## 2020-09-12 DIAGNOSIS — J329 Chronic sinusitis, unspecified: Secondary | ICD-10-CM

## 2020-09-12 NOTE — Progress Notes (Signed)
Ct Max

## 2020-09-28 ENCOUNTER — Ambulatory Visit
Admission: RE | Admit: 2020-09-28 | Discharge: 2020-09-28 | Disposition: A | Discharge status: Home / Self Care | Payer: Commercial Managed Care - PPO | Source: Ambulatory Visit | Attending: Otolaryngology | Admitting: Otolaryngology

## 2020-09-28 DIAGNOSIS — J329 Chronic sinusitis, unspecified: Secondary | ICD-10-CM

## 2020-10-16 ENCOUNTER — Other Ambulatory Visit: Payer: Self-pay

## 2020-10-16 ENCOUNTER — Ambulatory Visit (INDEPENDENT_AMBULATORY_CARE_PROVIDER_SITE_OTHER): Payer: Commercial Managed Care - PPO | Admitting: Otolaryngology

## 2020-10-16 VITALS — Temp 97.9°F

## 2020-10-16 DIAGNOSIS — J310 Chronic rhinitis: Secondary | ICD-10-CM

## 2020-10-16 DIAGNOSIS — J349 Unspecified disorder of nose and nasal sinuses: Secondary | ICD-10-CM

## 2020-10-16 DIAGNOSIS — H6123 Impacted cerumen, bilateral: Secondary | ICD-10-CM

## 2020-10-16 DIAGNOSIS — J342 Deviated nasal septum: Secondary | ICD-10-CM

## 2020-10-16 NOTE — Progress Notes (Signed)
HPI: Jason Duncan is a 55 y.o. male who returns today for evaluation of nose and sinuses.  He apparently has had history of sinus infections in "sinus headaches".  This is frequently in the cheek area as well as the left periorbital area.  He returns today following a CT scan of the sinuses to review this. He also has chronic nasal obstruction at night in regular snoring.  He apparently had a sleep test several years ago and was diagnosed with obstructive sleep apnea but does not wear CPAP machine. More recently has been having some drainage from the left ear over the past month but no hearing problems. I reviewed the CT scan with the patient in the office today.  This showed clear paranasal sinuses except for some minimal mucosal thickening within the maxillary sinuses on both sides.  He does have a moderate septal deviation to the right midway and posteriorly with a large septal spur on the right side extending into the right inferior turbinate.  There were no air-fluid levels and no obstruction of the sinuses.  Past Medical History:  Diagnosis Date  . Erectile dysfunction   . Erectile dysfunction 06/24/2017  . GERD (gastroesophageal reflux disease)   . History of kidney stones   . Kidney stones    Past Surgical History:  Procedure Laterality Date  . COLONOSCOPY WITH PROPOFOL N/A 08/03/2019   Procedure: COLONOSCOPY WITH PROPOFOL;  Surgeon: Jonathon Bellows, MD;  Location: [facility] ENDOSCOPY;  Service: Gastroenterology;  Laterality: N/A;  . EXTERNAL EAR SURGERY Bilateral 1973  . LEG SURGERY Right 1988   rods placed due to broken leg  . SHOULDER SURGERY Right 2003   Social History   Socioeconomic History  . Marital status: Married    Spouse name: Sharyn Lull   . Number of children: 3  . Years of education: Not on file  . Highest education level: Some college, no degree  Occupational History  . Not on file  Tobacco Use  . Smoking status: Never Smoker  . Smokeless tobacco: Current User     Types: Chew  Vaping Use  . Vaping Use: Never used  Substance and Sexual Activity  . Alcohol use: No  . Drug use: No    Comment: used to smoke pot from [age] y old   . Sexual activity: Not Currently  Other Topics Concern  . Not on file  Social History Narrative  . Not on file   Social Determinants of Health   Financial Resource Strain: Low Risk   . Difficulty of Paying Living Expenses: Not hard at all  Food Insecurity: No Food Insecurity  . Worried About Charity fundraiser in the Last Year: Never true  . Ran Out of Food in the Last Year: Never true  Transportation Needs: No Transportation Needs  . Lack of Transportation (Medical): No  . Lack of Transportation (Non-Medical): No  Physical Activity: Insufficiently Active  . Days of Exercise per Week: 1 day  . Minutes of Exercise per Session: 20 min  Stress: Stress Concern Present  . Feeling of Stress : Rather much  Social Connections: Moderately Integrated  . Frequency of Communication with Friends and Family: More than three times a week  . Frequency of Social Gatherings with Friends and Family: Once a week  . Attends Religious Services: More than 4 times per year  . Active Member of Clubs or Organizations: No  . Attends Archivist Meetings: Never  . Marital Status: Married   Family History  Problem Relation Age of Onset  . Hypertension Mother   . Pneumonia Maternal Grandmother   . Cancer Maternal Grandfather        lung  . Heart disease Brother        43 years old, died from a heart attack   Allergies  Allergen Reactions  . Elemental Sulfur    Prior to Admission medications   Medication Sig Start Date End Date Taking? Authorizing Provider  tadalafil (CIALIS) 20 MG tablet Take 0.5-1 tablets (10-20 mg total) by mouth every other day as needed for erectile dysfunction. 07/18/20   Delsa Grana, PA-C     Positive ROS: Otherwise negative  All other systems have been reviewed and were otherwise negative with  the exception of those mentioned in the HPI and as above.  Physical Exam: Constitutional: Alert, well-appearing, no acute distress Ears: External ears without lesions or tenderness.  He had wax buildup in the right ear canal that was cleaned with suction and forceps.  The right ear canal and right TM otherwise clear.  The left ear canal had minimal wax buildup that was cleaned with a curette.  Has some slight moisture in the ear canal.  After cleaning the ear canal with hydroperoxide and alcohol I applied gentian violet and CSF powder to the left ear only. Nasal: External nose without lesions. Septum anteriorly is deviated to the left narrowing the left nostril.  More posteriorly is deviated to the right with a large right septal spur.  Both middle meatus regions were clear with no purulent discharge.. Clear nasal passages otherwise. Oral: Lips and gums without lesions. Tongue and palate mucosa without lesions. Posterior oropharynx clear.  Patient is status post tonsillectomy has small average size uvula. Neck: No palpable adenopathy or masses Respiratory: Breathing comfortably  Skin: No facial/neck lesions or rash noted.  Cerumen impaction removal  Date/Time: 10/16/2020 5:21 PM Performed by: Rozetta Nunnery, MD Authorized by: Rozetta Nunnery, MD   Consent:    Consent obtained:  Verbal   Consent given by:  Patient   Risks discussed:  Pain and bleeding Procedure details:    Location:  L ear and R ear   Procedure type: curette and forceps   Post-procedure details:    Inspection:  TM intact and canal normal   Hearing quality:  Improved   Patient tolerance of procedure:  Tolerated well, no immediate complications Comments:     Ear canals were cleaned with curette and forceps.  I then applied gentian violet and CSF powder to the left ear after cleaning the ear canal.    Assessment: Septal deviation with nasal obstruction and snoring. Minimal sinus disease of the maxillary  sinus. History of obstructive sleep apnea   Plan: Discussed with patient concerning septoplasty and turbinate reductions and bilateral maxillary ostia enlargement since his symptoms are within the maxillary region with minimal disease on CT scan.  However will need to wait until overnight elective surgery is able to be scheduled.  He will call us back in a month to schedule surgery possibly. Because of history of obstructive sleep apnea and need for nasal packing will need overnight stay. He will notify us if he has any further drainage from the left ear. Discussed with him that surgery would help improve his breathing especially at night and hopefully also his snoring.   Radene Journey, MD

## 2020-11-10 IMAGING — DX PORTABLE CHEST - 1 VIEW
1 series · 1 of 1 positions shown · non-contrast
Comparison: 09/25/2010

CLINICAL DATA: Here for diarrhea X 2 days and left chest tightness.
Had NVD last week after eating fish sandwich. Also reports that
pulled tick off his right leg the other day. No fever. Non-smoker.

EXAM:
PORTABLE CHEST 1 VIEW

[chest ap]
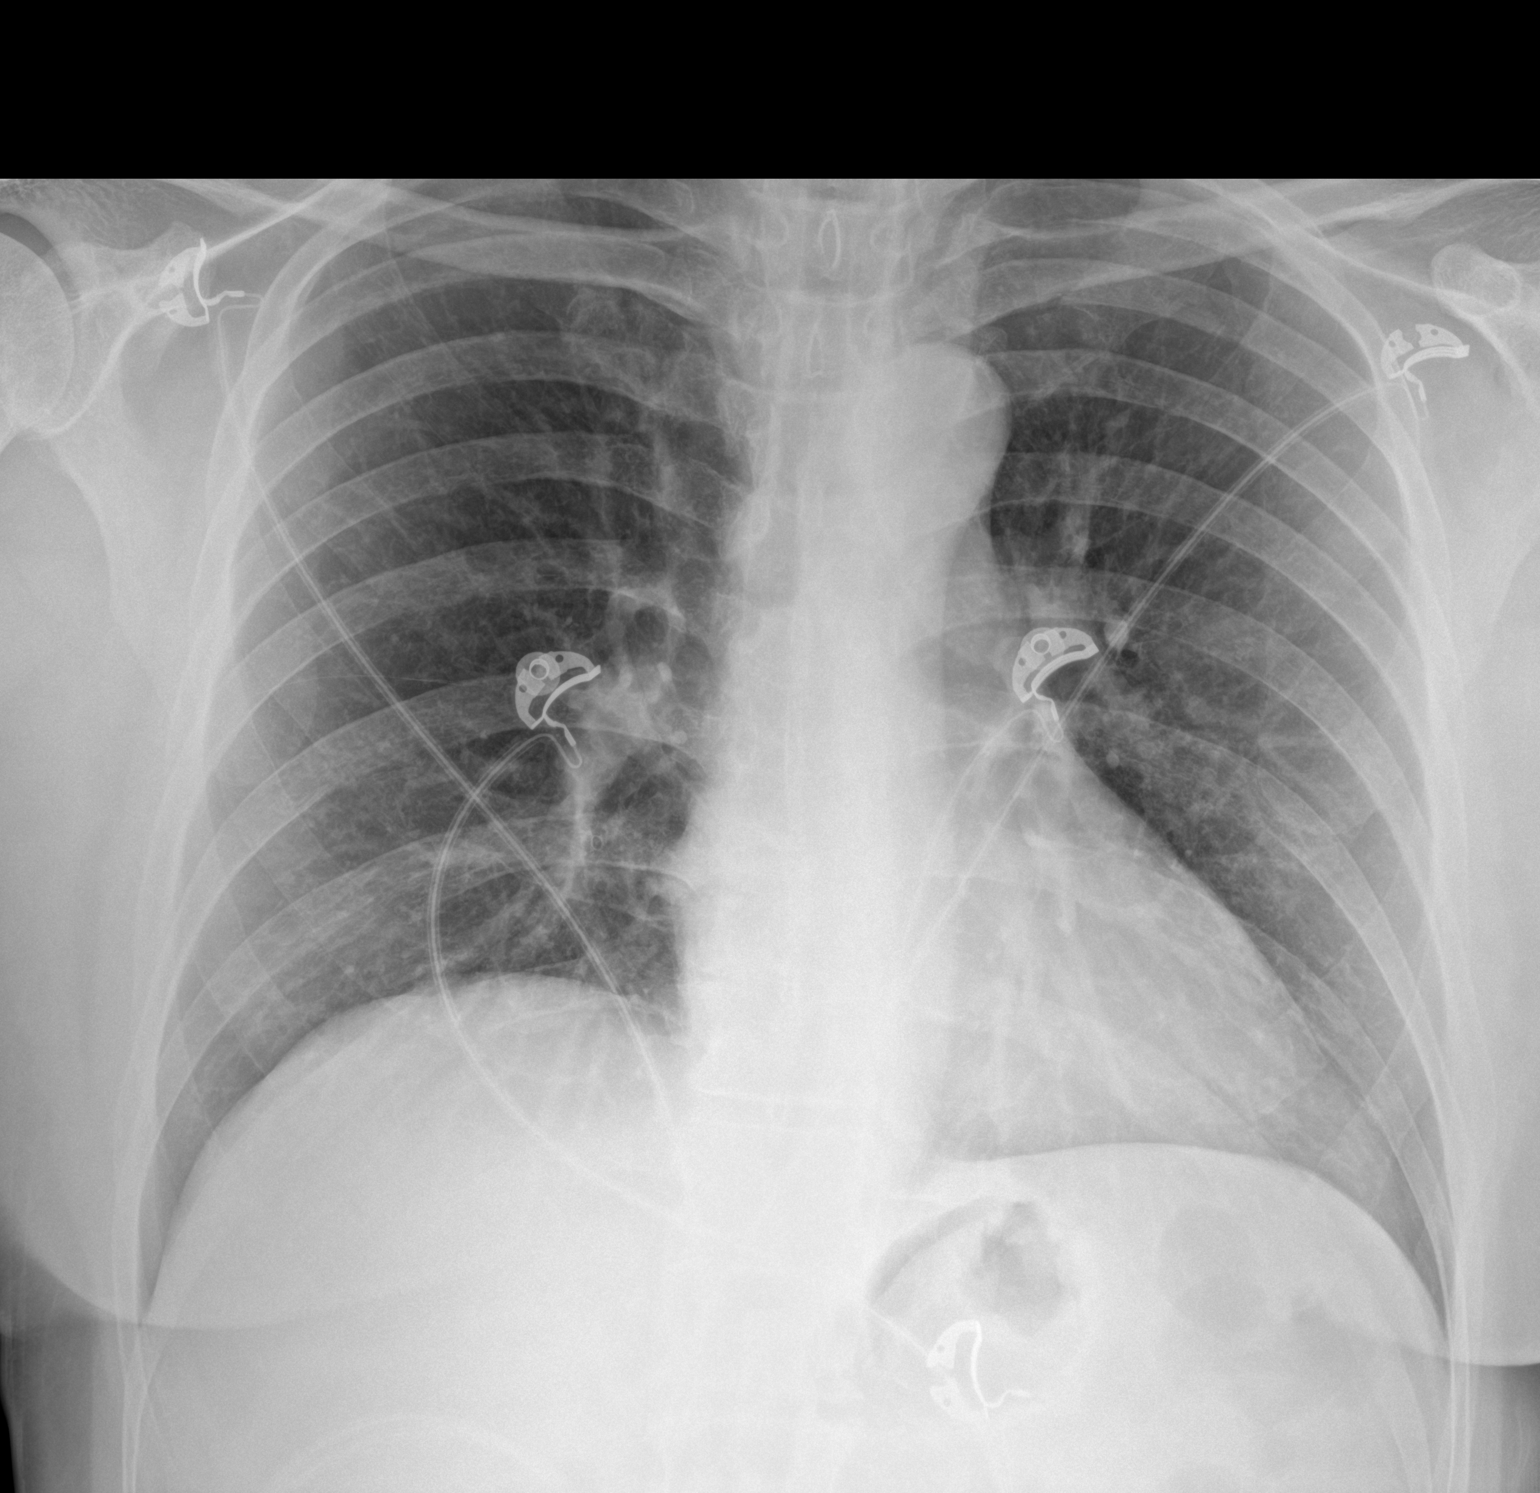

[1 of 1 positions shown; findings below may reference images not displayed]

FINDINGS: Stable widening of the right AC joint, suspect distal clavicular
resection. Skeleton otherwise unremarkable.

The lungs appear clear.  Cardiac and mediastinal contours normal.

No pleural effusion identified.
IMPRESSION: 1.  No active cardiopulmonary disease is radiographically apparent.
2. Prior right distal clavicular resection.

## 2020-11-10 IMAGING — CT CT ABDOMEN AND PELVIS WITH CONTRAST
2 of 5 series · 15 of 46 positions shown, 17 images · IV contrast (APPLIED)
Comparison: 09/26/2004

CLINICAL DATA: Abdominal pain, diarrhea, nausea

EXAM:
CT ABDOMEN AND PELVIS WITH CONTRAST
TECHNIQUE: Multidetector CT imaging of the abdomen and pelvis was performed
using the standard protocol following bolus administration of
intravenous contrast.
CONTRAST:  100mL OMNIPAQUE IOHEXOL 300 MG/ML  SOLN

[Series 2: routine abd/pel with · axial · 0.70mm/px · z∈[-447,-12]mm · 12 of 99 slices shown, 14 images]
[im 6/99  soft-tissue]
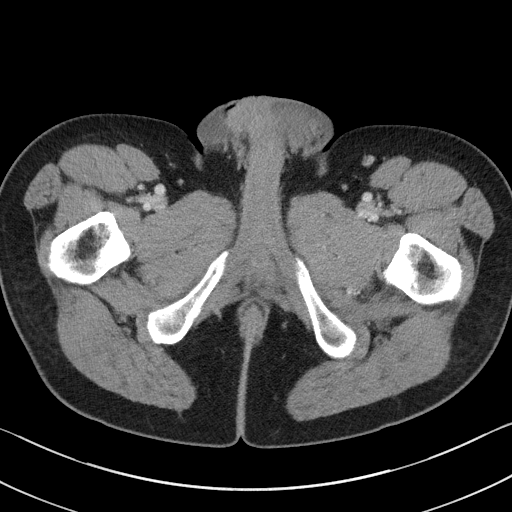
[im 6/99  bone]
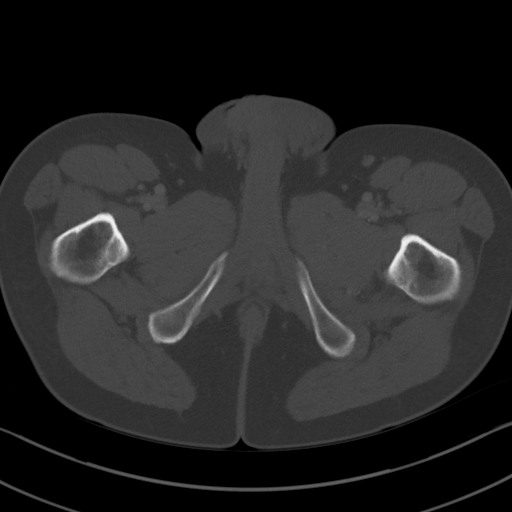
[im 17/99  soft-tissue]
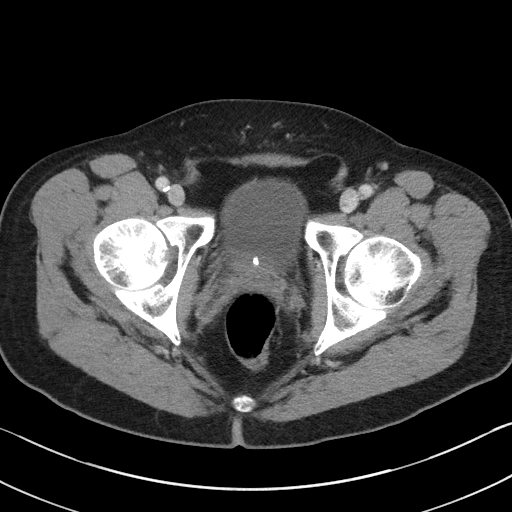
[im 22/99  soft-tissue]
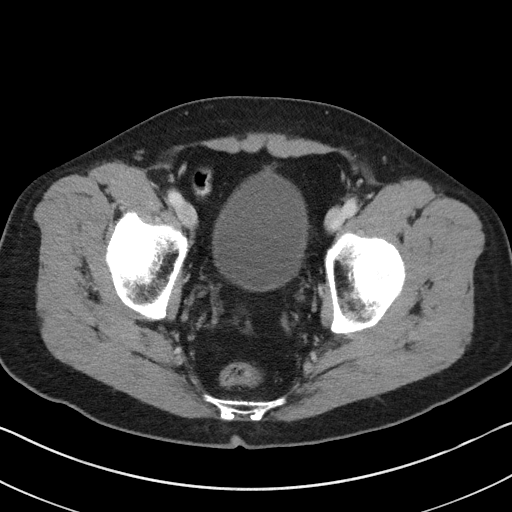
[im 28/99  soft-tissue]
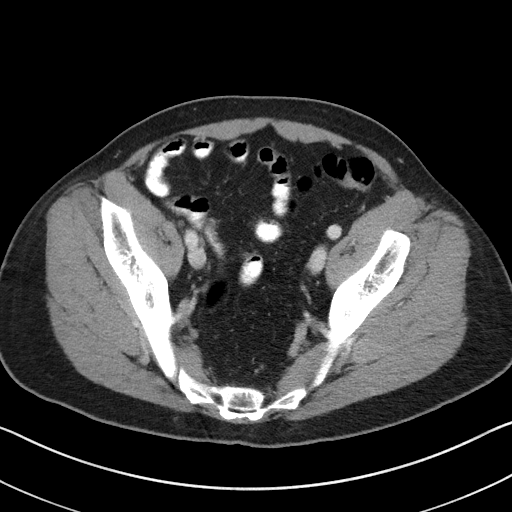
[im 39/99  soft-tissue]
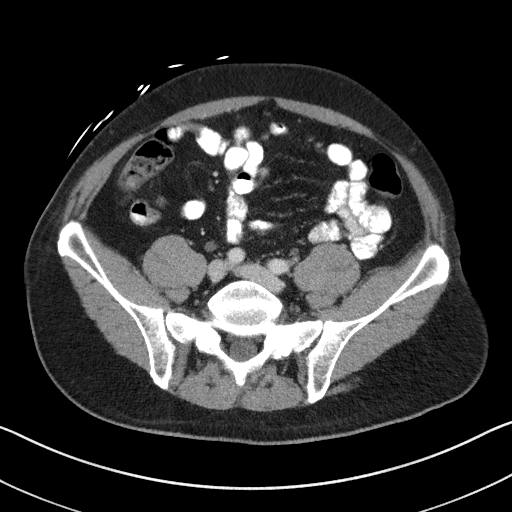
[im 44/99  soft-tissue]
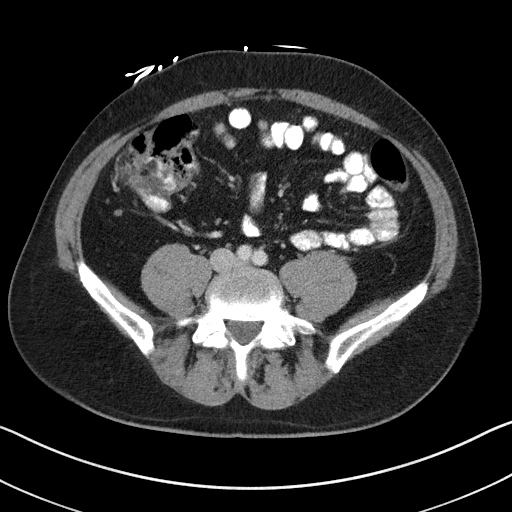
[im 55/99  soft-tissue]
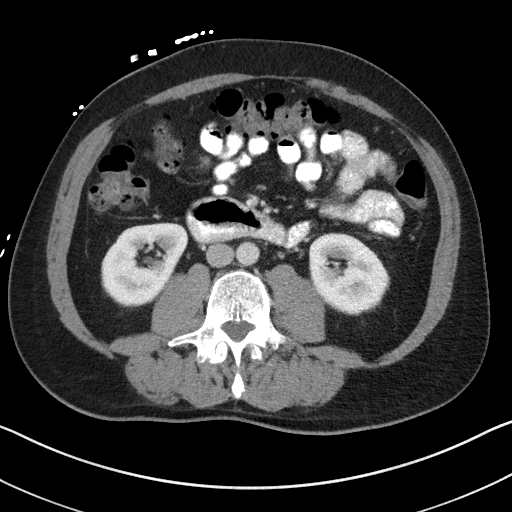
[im 60/99  soft-tissue]
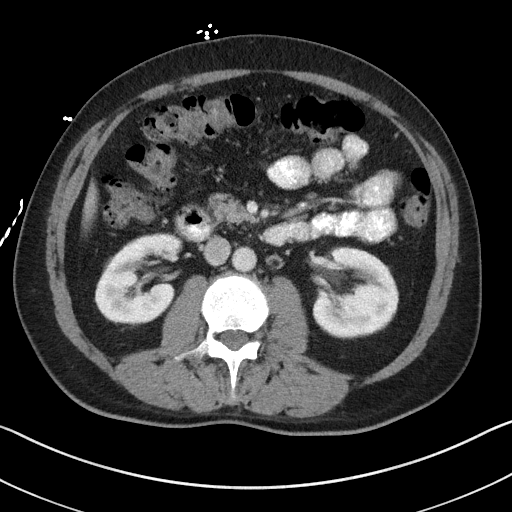
[im 71/99  soft-tissue]
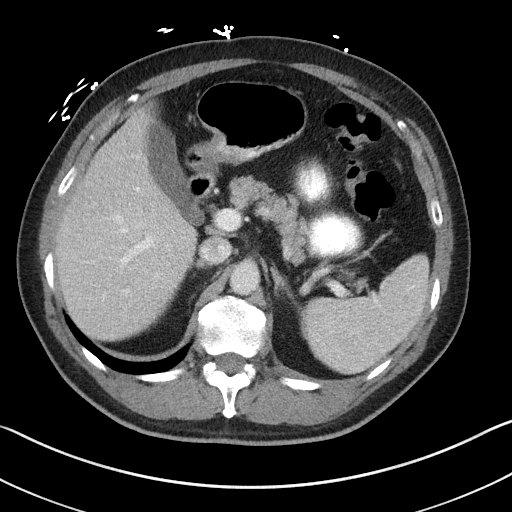
[im 71/99  bone]
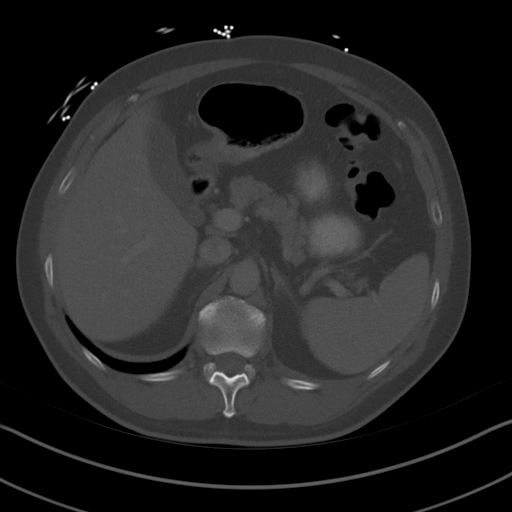
[im 77/99  soft-tissue]
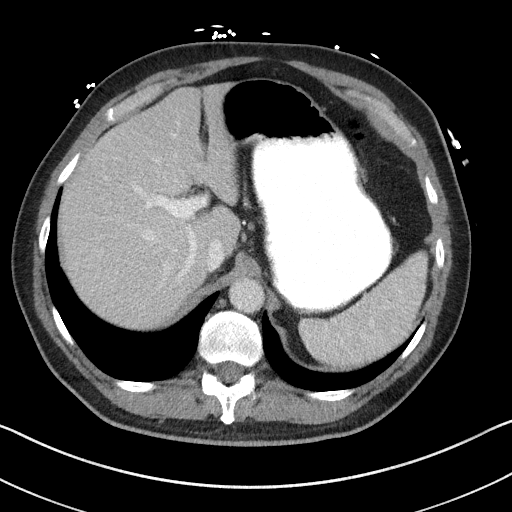
[im 82/99  soft-tissue]
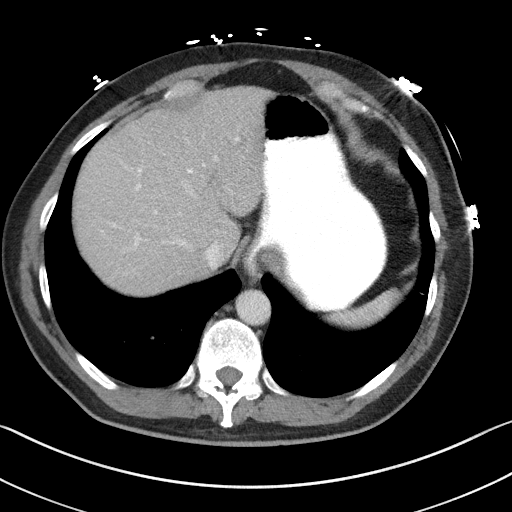
[im 93/99  soft-tissue]
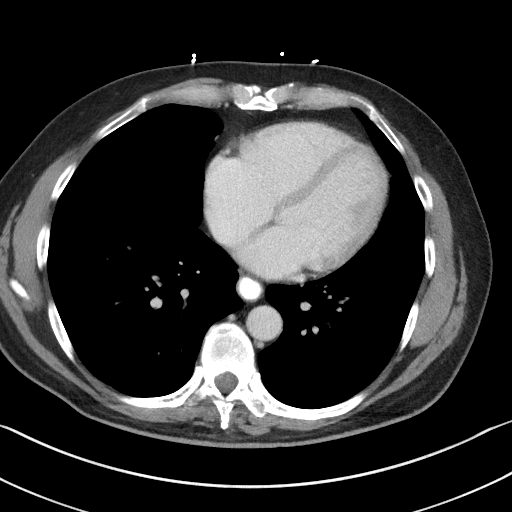

[Series 5: coronal st · coronal · 0.67mm/px · 3 of 94 slices shown]
[im 32/94  soft-tissue]
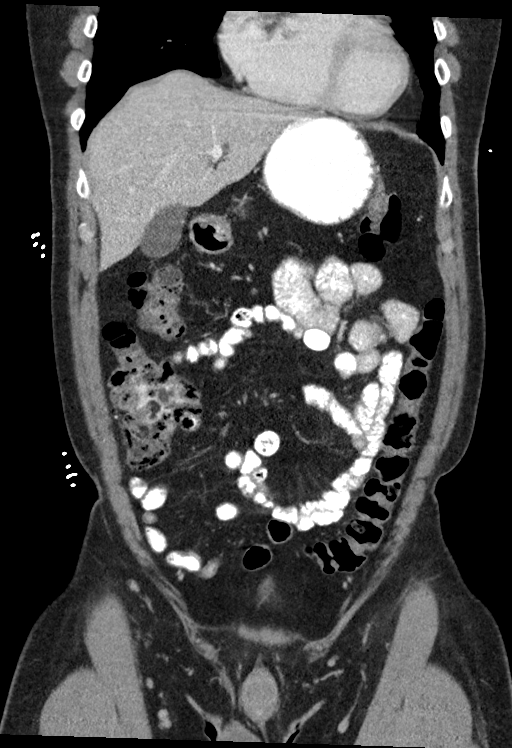
[im 42/94  soft-tissue]
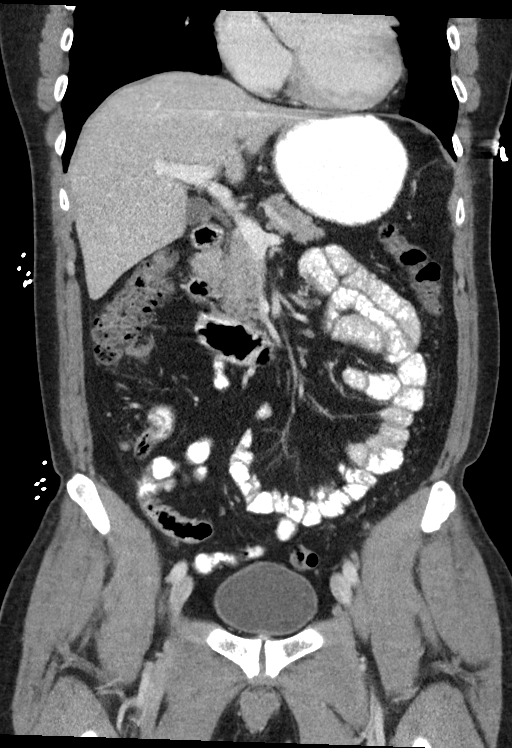
[im 52/94  soft-tissue]
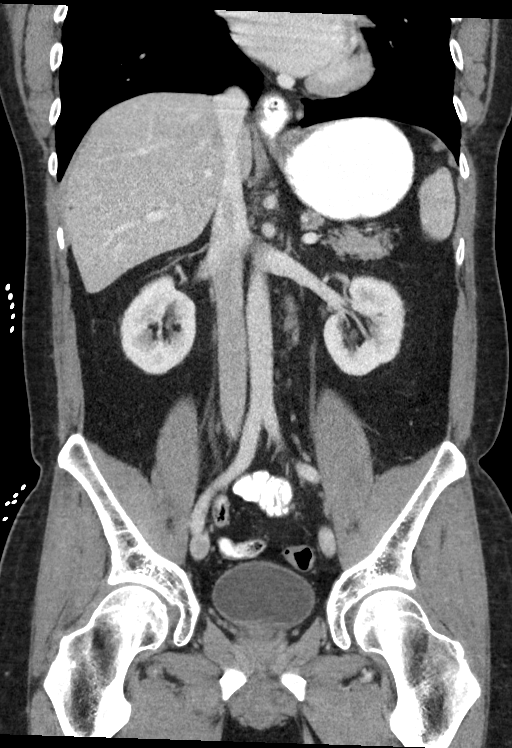

[15 of 46 positions shown; findings below may reference images not displayed]

FINDINGS: Lower chest: Normal heart size. No pericardial or pleural effusion.
Small hiatal hernia. Residual contrast in the lower esophagus
suggests reflux.

Hepatobiliary: No focal liver abnormality is seen. No gallstones,
gallbladder wall thickening, or biliary dilatation.

Pancreas: Unremarkable. No pancreatic ductal dilatation or
surrounding inflammatory changes.

Spleen: Normal in size without focal abnormality.

Adrenals/Urinary Tract: Normal adrenal glands. Hypodense upper pole
left renal cyst measures 10 mm. No renal obstruction or
hydronephrosis. No hydroureter or ureteral calculus on either side.
Urinary bladder unremarkable.

Stomach/Bowel: Negative for bowel obstruction, significant
dilatation, ileus, or free air. Normal retrocecal appendix. No acute
inflammatory process or bowel wall thickening. No fluid collection,
hemorrhage, hematoma, abscess or ascites.

Vascular/Lymphatic: Mesenteric and renal vasculature appear patent.
No aortoiliac disease. Negative for aneurysm. No bulky adenopathy.

Reproductive: Prostate calcifications noted. Symmetric seminal
vesicles. No acute finding by CT

Other: No inguinal abnormality. Small fat containing umbilical
hernia. No large ventral hernia. No free fluid or ascites

Musculoskeletal: Degenerative changes of the spine.
IMPRESSION: No acute intra-abdominopelvic finding. Small hiatal hernia with oral
contrast in the lower esophagus may represent reflux.

## 2021-01-16 ENCOUNTER — Ambulatory Visit: Payer: Commercial Managed Care - PPO | Admitting: Family Medicine

## 2021-01-16 DIAGNOSIS — G4733 Obstructive sleep apnea (adult) (pediatric): Secondary | ICD-10-CM

## 2021-01-16 DIAGNOSIS — E669 Obesity, unspecified: Secondary | ICD-10-CM

## 2021-01-16 DIAGNOSIS — G4700 Insomnia, unspecified: Secondary | ICD-10-CM

## 2021-01-16 DIAGNOSIS — N529 Male erectile dysfunction, unspecified: Secondary | ICD-10-CM

## 2021-01-16 DIAGNOSIS — E785 Hyperlipidemia, unspecified: Secondary | ICD-10-CM

## 2021-04-23 ENCOUNTER — Ambulatory Visit: Payer: Self-pay | Admitting: *Deleted

## 2021-04-23 NOTE — Telephone Encounter (Signed)
Reason for Disposition  Dizziness or lightheadedness  Answer Assessment - Initial Assessment Questions 1. LOCATION: "Where does it hurt?"       L breast- toward middle of chest 2. RADIATION: "Does the pain go anywhere else?" (e.g., into neck, jaw, arms, back)     Yesterday it was in shoulder and middle of back 3. ONSET: "When did the chest pain begin?" (Minutes, hours or days)      Last Wednesday 4. PATTERN "Does the pain come and go, or has it been constant since it started?"  "Does it get worse with exertion?"      Comes and goes- gets worse if sits 5. DURATION: "How long does it last" (e.g., seconds, minutes, hours)     hours 6. SEVERITY: "How bad is the pain?"  (e.g., Scale 1-10; mild, moderate, or severe)    - MILD (1-3): doesn't interfere with normal activities     - MODERATE (4-7): interferes with normal activities or awakens from sleep    - SEVERE (8-10): excruciating pain, unable to do any normal activities       mild 7. CARDIAC RISK FACTORS: "Do you have any history of heart problems or risk factors for heart disease?" (e.g., angina, prior heart attack; diabetes, high blood pressure, high cholesterol, smoker, or strong family history of heart disease)     no 8. PULMONARY RISK FACTORS: "Do you have any history of lung disease?"  (e.g., blood clots in lung, asthma, emphysema, birth control pills)     no 9. CAUSE: "What do you think is causing the chest pain?"     Unsure- hx hiatal hernia 10. OTHER SYMPTOMS: "Do you have any other symptoms?" (e.g., dizziness, nausea, vomiting, sweating, fever, difficulty breathing, cough)       Dizziness, nausea, vision changes- today pale, vision slightly blurrry 11. PREGNANCY: "Is there any chance you are pregnant?" "When was your last menstrual period?"       N/a  Protocols used: Chest Pain-A-AH

## 2021-04-23 NOTE — Telephone Encounter (Signed)
Patient calling to report he started having chest pain on Wednesday- and EMT were called. Patient states EKG was normal-but BP readings were very high- 216/121 and 176/?. Patient states he went to his sister's office- and was given fluids for dehydration. Patient was advised he needed to get labs. Patient states he has had chest discomfort/pain on/off all week end. He is still not feeling well. Patient states he has had dizziness, vision changes, general malaise. Patient advised ED for evaluation- BP has not been checked since EMT check- he states he will go to ED after work- but did agree to go see nurse at work- he will get them to check his BP and agrees to go to ED if the advise. Will send message to PCP. Advised if chest pain increases- that is 911 call.

## 2021-04-24 ENCOUNTER — Ambulatory Visit: Payer: Commercial Managed Care - PPO | Admitting: Family Medicine

## 2021-04-24 ENCOUNTER — Other Ambulatory Visit
Admission: RE | Admit: 2021-04-24 | Discharge: 2021-04-24 | Disposition: A | Discharge status: Home / Self Care | Payer: Commercial Managed Care - PPO | Source: Ambulatory Visit | Attending: Family Medicine | Admitting: Family Medicine

## 2021-04-24 ENCOUNTER — Encounter: Payer: Self-pay | Admitting: Family Medicine

## 2021-04-24 ENCOUNTER — Other Ambulatory Visit: Payer: Self-pay | Admitting: Family Medicine

## 2021-04-24 ENCOUNTER — Other Ambulatory Visit: Payer: Self-pay

## 2021-04-24 VITALS — BP 144/100 | HR 64 | Temp 98.0°F | Resp 16 | Ht 68.0 in | Wt 191.1 lb

## 2021-04-24 DIAGNOSIS — R079 Chest pain, unspecified: Secondary | ICD-10-CM | POA: Diagnosis not present

## 2021-04-24 DIAGNOSIS — R0789 Other chest pain: Secondary | ICD-10-CM | POA: Insufficient documentation

## 2021-04-24 LAB — CBC WITH DIFFERENTIAL/PLATELET
Abs Immature Granulocytes: 0.01 10*3/uL (ref 0.00–0.07)
Basophils Absolute: 0 10*3/uL (ref 0.0–0.1)
Basophils Relative: 1 %
Eosinophils Absolute: 0.2 10*3/uL (ref 0.0–0.5)
Eosinophils Relative: 3 %
HCT: 42.9 % (ref 39.0–52.0)
Hemoglobin: 15.3 g/dL (ref 13.0–17.0)
Immature Granulocytes: 0 %
Lymphocytes Relative: 26 %
Lymphs Abs: 1.6 10*3/uL (ref 0.7–4.0)
MCH: 31.7 pg (ref 26.0–34.0)
MCHC: 35.7 g/dL (ref 30.0–36.0)
MCV: 89 fL (ref 80.0–100.0)
Monocytes Absolute: 0.4 10*3/uL (ref 0.1–1.0)
Monocytes Relative: 6 %
Neutro Abs: 3.9 10*3/uL (ref 1.7–7.7)
Neutrophils Relative %: 64 %
Platelets: 252 10*3/uL (ref 150–400)
RBC: 4.82 MIL/uL (ref 4.22–5.81)
RDW: 12 % (ref 11.5–15.5)
WBC: 6.2 10*3/uL (ref 4.0–10.5)
nRBC: 0 % (ref 0.0–0.2)

## 2021-04-24 LAB — TROPONIN I (HIGH SENSITIVITY): Troponin I (High Sensitivity): 3 ng/L (ref <18)

## 2021-04-24 LAB — COMPREHENSIVE METABOLIC PANEL
ALT: 13 U/L (ref 0–44)
AST: 17 U/L (ref 15–41)
Albumin: 4.7 g/dL (ref 3.5–5.0)
Alkaline Phosphatase: 49 U/L (ref 38–126)
Anion gap: 11 (ref 5–15)
BUN: 15 mg/dL (ref 6–20)
CO2: 26 mmol/L (ref 22–32)
Calcium: 9.7 mg/dL (ref 8.9–10.3)
Chloride: 104 mmol/L (ref 98–111)
Creatinine, Ser: 0.87 mg/dL (ref 0.61–1.24)
GFR, Estimated: >60 mL/min (ref >60)
Glucose, Bld: 94 mg/dL (ref 70–99)
Potassium: 3.4 mmol/L — ABNORMAL LOW (ref 3.5–5.1)
Sodium: 141 mmol/L (ref 135–145)
Total Bilirubin: 1.1 mg/dL (ref 0.3–1.2)
Total Protein: 7.5 g/dL (ref 6.5–8.1)

## 2021-04-24 MED ORDER — PANTOPRAZOLE SODIUM 20 MG PO TBEC: 20.0000 mg | DELAYED_RELEASE_TABLET | Freq: Every day | ORAL | 0 refills | Status: DC

## 2021-04-24 MED ORDER — LOSARTAN POTASSIUM 25 MG PO TABS: 25.0000 mg | ORAL_TABLET | Freq: Every day | ORAL | 0 refills | Status: DC

## 2021-04-24 MED ORDER — NITROGLYCERIN 0.4 MG SL SUBL: 0.4000 mg | SUBLINGUAL_TABLET | SUBLINGUAL | 0 refills | Status: DC | PRN

## 2021-04-24 NOTE — Progress Notes (Signed)
Patient notified of labs.   

## 2021-04-24 NOTE — Assessment & Plan Note (Addendum)
Unclear etiology but with significant cardiac risk factors. Unable to pull ED results from last night. EKG here without ischemic findings, exam unrevealing. Discussed most appropriate setting for w/u is ED but patient does not want to go. Will obtain STAT labs. Unlikely PE, PNA, pericarditis given sx and exam. Higher BP likely contributing, will start losartan. Possible GERD contributing given laying down and sitting make worse and not currently on PPI but given cardiac risk factors, requires further workup. If remainder of initial cardiac w/u unrevealing, refer to cardiology for stress testing. Rx given for NTG and PPI. Emergency precautions discussed.

## 2021-04-24 NOTE — Telephone Encounter (Signed)
Last seen 8.9.2022 upcoming 11.3.2022

## 2021-04-24 NOTE — Patient Instructions (Addendum)
It was great to see you!  Our plans for today:  - We are referring you to Cardiology. If you don't hear about an appointment in the next week, call us. - Take the losartan daily. - Take the nitroglycerin when you are having pain. Don't take within 4 hours of cialis. - If your pain worsens, you should go to the Emergency Department.  We are checking some labs today, we will call you with these results.   Take care and seek immediate care sooner if you develop any concerns.   Dr. Ky Barban

## 2021-04-24 NOTE — Progress Notes (Addendum)
    SUBJECTIVE:   CHIEF COMPLAINT / HPI:   CHEST PAIN - Last Wednesday had chest pain when woke up, took antacid didn't help. Called EMT at work, normal EKG but high BP readings (200s/100s). Declined going to ED. On Thursday went to [employer] (sister's office), received IVF with improvement. On Sunday and Monday woke up with chest pain again, took antacids without relief. - currently feels weak, tired, pale - 188/112 this morning. Went to work, felt worse.  - had labs and EKG last night at [facility] ED, was told everything was normal but did not stay to be evaluated.   Duration:days Onset:  upon wakening Quality: aching and pressure-like Location: left para substernal Radiation:  sometimes L shoulder Frequency: intermittent Related to exertion: no Activity when pain started: sleeping Trauma: no Anxiety/recent stressors: yes Aggravating factors: sitting Alleviating factors: advil, prilosec Status: fluctuating Treatments attempted: prilosec, advil Current pain status: in pain Shortness of breath: no Cough:  occasional Nausea: yes Diaphoresis: yes Heartburn: no Palpitations: yes Pleuritic pain: no Leg swelling: no Syncope: no Immobility: no Recent surgeries: no  PMH History of hypertension, cholesterol, diabetes obesity, smoking, early cardiac family history, CAD, PAD:  - dad with open heart surgery, unsure of age. Brother with MI at 57 with sudden death   OBJECTIVE:   BP (!) 144/100   Pulse 64   Temp 98 F (36.7 C) (Oral)   Resp 16   Ht '5\' 8"'$  (1.727 m)   Wt 191 lb 1.6 oz (86.7 kg)   SpO2 99%   BMI 29.06 kg/m   Gen: ill appearing, in NAD Card: RRR, no friction rub, no murmur. Chest wall nonTTP. Lungs: CTAB, no crackles, wheeze Ext: WWP, no edema   ASSESSMENT/PLAN:   Chest pain Unclear etiology but with significant cardiac risk factors. Unable to pull ED results from last night. EKG here without ischemic findings, exam unrevealing. Discussed most  appropriate setting for w/u is ED but patient does not want to go. Will obtain STAT labs. Unlikely PE, PNA, pericarditis given sx and exam. Higher BP likely contributing, will start losartan. Possible GERD contributing given laying down and sitting make worse and not currently on PPI but given cardiac risk factors, requires further workup. If remainder of initial cardiac w/u unrevealing, refer to cardiology for stress testing. Rx given for NTG and PPI. Emergency precautions discussed.    Addendum: CMP, CBC unremarkable. STAT troponin negative.   Myles Gip, DO

## 2021-04-30 ENCOUNTER — Other Ambulatory Visit: Payer: Self-pay

## 2021-04-30 ENCOUNTER — Ambulatory Visit: Payer: Commercial Managed Care - PPO | Admitting: Cardiology

## 2021-04-30 ENCOUNTER — Encounter: Payer: Self-pay | Admitting: Cardiology

## 2021-04-30 VITALS — BP 134/94 | HR 66 | Ht 69.0 in | Wt 187.0 lb

## 2021-04-30 DIAGNOSIS — I10 Essential (primary) hypertension: Secondary | ICD-10-CM

## 2021-04-30 DIAGNOSIS — R072 Precordial pain: Secondary | ICD-10-CM

## 2021-04-30 MED ORDER — METOPROLOL TARTRATE 100 MG PO TABS: 100.0000 mg | ORAL_TABLET | Freq: Once | ORAL | 0 refills | Status: DC

## 2021-04-30 NOTE — Patient Instructions (Addendum)
Medication Instructions:  Your physician recommends that you continue on your current medications as directed. Please refer to the Current Medication list given to you today.  *If you need a refill on your cardiac medications before your next appointment, please call your pharmacy*   Lab Work:  BMP drawn in office today.  If you have labs (blood work) drawn today and your tests are completely normal, you will receive your results only by: [place] (if you have MyChart) OR A paper copy in the mail If you have any lab test that is abnormal or we need to change your treatment, we will call you to review the results.   Testing/Procedures:   Your physician has requested that you have an echocardiogram. Echocardiography is a painless test that uses sound waves to create images of your heart. It provides your doctor with information about the size and shape of your heart and how well your heart's chambers and valves are working. This procedure takes approximately one hour. There are no restrictions for this procedure.   2.  Your physician has requested that you have cardiac CT. Cardiac computed tomography (CT) is a painless test that uses an x-ray machine to take clear, detailed pictures of your heart.    Your cardiac CT will be scheduled at :   [facility] [address] [place], [place] [address] [phone]  Please arrive 15 mins early for check-in and test prep.    Please follow these instructions carefully (unless otherwise directed):   Hold all erectile dysfunction medications at least 3 days (72 hrs) prior to test. tadalafil (CIALIS) 20 MG tablet   On the Night Before the Test: Be sure to Drink plenty of water. Do not consume any caffeinated/decaffeinated beverages or chocolate 12 hours prior to your test.   On the Day of the Test: Drink plenty of water until 1 hour prior to the test. Do not eat any food 4 hours  prior to the test. You may take your regular medications prior to the test.  Take metoprolol (Lopressor) two hours prior to test.        After the Test: Drink plenty of water. After receiving IV contrast, you may experience a mild flushed feeling. This is normal. On occasion, you may experience a mild rash up to 24 hours after the test. This is not dangerous. If this occurs, you can take Benadryl 25 mg and increase your fluid intake. If you experience trouble breathing, this can be serious. If it is severe call 911 IMMEDIATELY. If it is mild, please call our office. If you take any of these medications: Glipizide/Metformin, Avandament, Glucavance, please do not take 48 hours after completing test unless otherwise instructed.   Please allow 2-4 weeks for scheduling of routine cardiac CTs. Some insurance companies require a pre-authorization which may delay scheduling of this test.   For non-scheduling related questions, please contact the cardiac imaging nurse navigator should you have any questions/concerns: Marchia Bond, Cardiac Imaging Nurse Navigator Gordy Clement, Cardiac Imaging Nurse Navigator [facility] Direct Office Dial: [phone]   For scheduling needs, including cancellations and rescheduling, please call [place], [phone].    Follow-Up: At [facility], you and your health needs are our priority.  As part of our continuing mission to provide you with exceptional heart care, we have created designated Provider Care Teams.  These Care Teams include your primary Cardiologist (physician) and Advanced Practice Providers (APPs -  Physician Assistants and Nurse Practitioners) who all work together to provide you with the care you need, when you need it.  We recommend signing up for the patient portal called "MyChart".  Sign up information is provided on this After Visit Summary.  MyChart is used to connect with patients for Virtual Visits  (Telemedicine).  Patients are able to view lab/test results, encounter notes, upcoming appointments, etc.  Non-urgent messages can be sent to your provider as well.   To learn more about what you can do with MyChart, go to [email or web address].    Your next appointment:   Follow up after Echo and CCTA (which will be on 05/03/21)   The format for your next appointment:   In Person  Provider:   Kate Sable, MD   Other Instructions

## 2021-04-30 NOTE — Progress Notes (Signed)
Cardiology Office Note:    Date:  04/30/2021   ID:  Wandra Scot, DOB November 22, 1965, MRN [id number]  PCP:  Delsa Grana, PA-C   [facility] Providers Cardiologist:  Kate Sable, MD     Referring MD: Myles Gip, DO   Chief Complaint  Patient presents with   New Patient (Initial Visit)    Referred by PCP for Chest pain. Patient c.o leg pain at night. Patient states that last week while at work -- chest pain, elevated BP  190s/1teens and sweating. Meds reviewed verbally with patient.    Jason Duncan is a 55 y.o. male who is being seen today for the evaluation of chest pain at the request of Myles Gip, DO.   History of Present Illness:    Jason Duncan is a 55 y.o. male with a hx of hypertension, ED who presents due to chest pain.  Patient had an episode of chest pain about 10 days ago while at home, waking him up from sleep.  States not doing too well the next couple of days, had another incident 3 days later, went to church in the morning but could not go to church in the evening.  He initially thought his symptoms were secondary to reflux as he has a hiatal hernia.  Took Tums without relief.  He works at [employer], was seen there by available EMS, EKG was obtained, was advised patient go to the ED.  He went to [facility] ED, wait was too long, EKG and blood work was obtained.  Patient did not want to wait for observation.  Follow-up with primary care provider the next day.  His blood pressures were noted to be elevated.  Started on losartan, blood pressures are improving.  Denies any history of heart attacks.  His father had CABG in his 60s.   Stated  Past Medical History:  Diagnosis Date   Erectile dysfunction    Erectile dysfunction 06/24/2017   GERD (gastroesophageal reflux disease)    History of kidney stones    Kidney stones     Past Surgical History:  Procedure Laterality Date   COLONOSCOPY WITH PROPOFOL N/A 08/03/2019   Procedure:  COLONOSCOPY WITH PROPOFOL;  Surgeon: Jonathon Bellows, MD;  Location: [facility] ENDOSCOPY;  Service: Gastroenterology;  Laterality: N/A;   EXTERNAL EAR SURGERY Bilateral 1973   LEG SURGERY Right 1988   rods placed due to broken leg   SHOULDER SURGERY Right 2003    Current Medications: Current Meds  Medication Sig   fluticasone (FLONASE) 50 MCG/ACT nasal spray Place 1 spray into both nostrils daily.   losartan (COZAAR) 25 MG tablet Take 1 tablet (25 mg total) by mouth daily.   metoprolol tartrate (LOPRESSOR) 100 MG tablet Take 1 tablet (100 mg total) by mouth once for 1 dose. Take 2 hours prior to your CT scan.   nitroGLYCERIN (NITROSTAT) 0.4 MG SL tablet Place 1 tablet (0.4 mg total) under the tongue every 5 (five) minutes as needed for chest pain.   pantoprazole (PROTONIX) 20 MG tablet Take 1 tablet (20 mg total) by mouth daily.   tadalafil (CIALIS) 20 MG tablet Take 0.5-1 tablets (10-20 mg total) by mouth every other day as needed for erectile dysfunction.     Allergies:   Elemental sulfur   Social History   Socioeconomic History   Marital status: Married    Spouse name: Sharyn Lull    Number of children: 3   Years of education: Not on file  Highest education level: Some college, no degree  Occupational History   Not on file  Tobacco Use   Smoking status: Never   Smokeless tobacco: Current    Types: Chew  Vaping Use   Vaping Use: Never used  Substance and Sexual Activity   Alcohol use: No   Drug use: No    Comment: used to smoke pot from [age] y old    Sexual activity: Not Currently  Other Topics Concern   Not on file  Social History Narrative   Not on file   Social Determinants of Health   Financial Resource Strain: Low Risk    Difficulty of Paying Living Expenses: Not hard at all  Food Insecurity: No Food Insecurity   Worried About Charity fundraiser in the Last Year: Never true   Ran Out of Food in the Last Year: Never true  Transportation Needs: No Transportation Needs    Lack of Transportation (Medical): No   Lack of Transportation (Non-Medical): No  Physical Activity: Insufficiently Active   Days of Exercise per Week: 1 day   Minutes of Exercise per Session: 20 min  Stress: Stress Concern Present   Feeling of Stress : Rather much  Social Connections: Moderately Integrated   Frequency of Communication with Friends and Family: More than three times a week   Frequency of Social Gatherings with Friends and Family: Once a week   Attends Religious Services: More than 4 times per year   Active Member of [employer] or Organizations: No   Attends Music therapist: Never   Marital Status: Married     Family History: The patient's family history includes Cancer in his maternal grandfather; Heart disease in his brother; Hypertension in his mother; Pneumonia in his maternal grandmother.  ROS:   Please see the history of present illness.     All other systems reviewed and are negative.  EKGs/Labs/Other Studies Reviewed:    The following studies were reviewed today:   EKG:  EKG is  ordered today.  The ekg ordered today demonstrates normal sinus rhythm  Recent Labs: 04/24/2021: ALT 13; BUN 15; Creatinine, Ser 0.87; Hemoglobin 15.3; Platelets 252; Potassium 3.4; Sodium 141  Recent Lipid Panel    Component Value Date/Time   CHOL 148 07/18/2020 0856   TRIG 91 07/18/2020 0856   HDL 35 (L) 07/18/2020 0856   CHOLHDL 4.2 07/18/2020 0856   LDLCALC 94 07/18/2020 0856     Risk Assessment/Calculations:          Physical Exam:    VS:  BP (!) 134/94 (BP Location: Left Arm, Patient Position: Sitting, Cuff Size: Normal)   Pulse 66   Ht '5\' 9"'$  (1.753 m)   Wt 187 lb (84.8 kg)   SpO2 98%   BMI 27.62 kg/m     Wt Readings from Last 3 Encounters:  04/30/21 187 lb (84.8 kg)  04/24/21 191 lb 1.6 oz (86.7 kg)  07/18/20 197 lb 4.8 oz (89.5 kg)     GEN:  Well nourished, well developed in no acute distress HEENT: Normal NECK: No JVD; No carotid  bruits LYMPHATICS: No lymphadenopathy CARDIAC: RRR, no murmurs, rubs, gallops RESPIRATORY:  Clear to auscultation without rales, wheezing or rhonchi  ABDOMEN: Soft, non-tender, non-distended MUSCULOSKELETAL:  No edema; No deformity  SKIN: Warm and dry NEUROLOGIC:  Alert and oriented x 3 PSYCHIATRIC:  Normal affect   ASSESSMENT:    1. Precordial pain   2. Primary hypertension    PLAN:  In order of problems listed above:  Chest pain, history of hypertension.  Obtain echocardiogram, obtain coronary CTA to evaluate presence of CAD. Hypertension, BP improving.  Continue losartan 25 mg daily.  Follow-up after echo and coronary CTA.     Medication Adjustments/Labs and Tests Ordered: Current medicines are reviewed at length with the patient today.  Concerns regarding medicines are outlined above.  Orders Placed This Encounter  Procedures   CT CORONARY MORPH W/CTA COR W/SCORE W/CA W/CM &/OR WO/CM   Basic metabolic panel   EKG [id number]   ECHOCARDIOGRAM COMPLETE    Meds ordered this encounter  Medications   metoprolol tartrate (LOPRESSOR) 100 MG tablet    Sig: Take 1 tablet (100 mg total) by mouth once for 1 dose. Take 2 hours prior to your CT scan.    Dispense:  1 tablet    Refill:  0     Patient Instructions  Medication Instructions:  Your physician recommends that you continue on your current medications as directed. Please refer to the Current Medication list given to you today.  *If you need a refill on your cardiac medications before your next appointment, please call your pharmacy*   Lab Work:  BMP drawn in office today.  If you have labs (blood work) drawn today and your tests are completely normal, you will receive your results only by: [place] (if you have MyChart) OR A paper copy in the mail If you have any lab test that is abnormal or we need to change your treatment, we will call you to review the results.   Testing/Procedures:   Your  physician has requested that you have an echocardiogram. Echocardiography is a painless test that uses sound waves to create images of your heart. It provides your doctor with information about the size and shape of your heart and how well your heart's chambers and valves are working. This procedure takes approximately one hour. There are no restrictions for this procedure.   2.  Your physician has requested that you have cardiac CT. Cardiac computed tomography (CT) is a painless test that uses an x-ray machine to take clear, detailed pictures of your heart.    Your cardiac CT will be scheduled at :   [facility] [address] [place], [place] [address] [phone]  Please arrive 15 mins early for check-in and test prep.    Please follow these instructions carefully (unless otherwise directed):   Hold all erectile dysfunction medications at least 3 days (72 hrs) prior to test. tadalafil (CIALIS) 20 MG tablet   On the Night Before the Test: Be sure to Drink plenty of water. Do not consume any caffeinated/decaffeinated beverages or chocolate 12 hours prior to your test.   On the Day of the Test: Drink plenty of water until 1 hour prior to the test. Do not eat any food 4 hours prior to the test. You may take your regular medications prior to the test.  Take metoprolol (Lopressor) two hours prior to test.        After the Test: Drink plenty of water. After receiving IV contrast, you may experience a mild flushed feeling. This is normal. On occasion, you may experience a mild rash up to 24 hours after the test. This is not dangerous. If this occurs, you can take Benadryl 25 mg and increase your fluid intake. If you experience trouble breathing, this can be serious. If it is severe call 911 IMMEDIATELY. If it  is mild, please call our office. If you take any of these medications: Glipizide/Metformin, Avandament, Glucavance, please do  not take 48 hours after completing test unless otherwise instructed.   Please allow 2-4 weeks for scheduling of routine cardiac CTs. Some insurance companies require a pre-authorization which may delay scheduling of this test.   For non-scheduling related questions, please contact the cardiac imaging nurse navigator should you have any questions/concerns: Marchia Bond, Cardiac Imaging Nurse Navigator Gordy Clement, Cardiac Imaging Nurse Navigator [facility] Direct Office Dial: [phone]   For scheduling needs, including cancellations and rescheduling, please call [place], [phone].    Follow-Up: At [facility], you and your health needs are our priority.  As part of our continuing mission to provide you with exceptional heart care, we have created designated Provider Care Teams.  These Care Teams include your primary Cardiologist (physician) and Advanced Practice Providers (APPs -  Physician Assistants and Nurse Practitioners) who all work together to provide you with the care you need, when you need it.  We recommend signing up for the patient portal called "MyChart".  Sign up information is provided on this After Visit Summary.  MyChart is used to connect with patients for Virtual Visits (Telemedicine).  Patients are able to view lab/test results, encounter notes, upcoming appointments, etc.  Non-urgent messages can be sent to your provider as well.   To learn more about what you can do with MyChart, go to [email or web address].    Your next appointment:   Follow up after Echo and CCTA (which will be on 05/03/21)   The format for your next appointment:   In Person  Provider:   Kate Sable, MD   Other Instructions    Signed, Kate Sable, MD  04/30/2021 4:43 PM    [place]

## 2021-05-01 ENCOUNTER — Telehealth (HOSPITAL_COMMUNITY): Payer: Self-pay | Admitting: Emergency Medicine

## 2021-05-01 LAB — BASIC METABOLIC PANEL
BUN/Creatinine Ratio: 20 (ref 9–20)
BUN: 19 mg/dL (ref 6–24)
CO2: 23 mmol/L (ref 20–29)
Calcium: 10.1 mg/dL (ref 8.7–10.2)
Chloride: 106 mmol/L (ref 96–106)
Creatinine, Ser: 0.94 mg/dL (ref 0.76–1.27)
Glucose: 86 mg/dL (ref 65–99)
Potassium: 4.2 mmol/L (ref 3.5–5.2)
Sodium: 144 mmol/L (ref 134–144)
eGFR: 97 mL/min/{1.73_m2} (ref >59)

## 2021-05-01 NOTE — Telephone Encounter (Signed)
Reaching out to patient to offer assistance regarding upcoming cardiac imaging study; pt verbalizes understanding of appt date/time, parking situation and where to check in, pre-test NPO status and medications ordered, and verified current allergies; name and call back number provided for further questions should they arise Marchia Bond RN Navigator Cardiac Imaging [facility] [phone] office [phone] cell  Denies iv issues Denies claustro '100mg'$  metoprolol tart 2 hr prior  Holding cialis

## 2021-05-03 ENCOUNTER — Ambulatory Visit: Admission: RE | Admit: 2021-05-03 | Payer: Commercial Managed Care - PPO | Source: Ambulatory Visit

## 2021-05-03 ENCOUNTER — Telehealth: Payer: Self-pay | Admitting: Cardiology

## 2021-05-03 NOTE — Telephone Encounter (Signed)
Jason Duncan, [place] at [facility] states patient refused to have CT performed this morning states he will not have the money to pay his copay. Jason Duncan offered him a payment plan, but patient still refused. Please advise patient of other options.

## 2021-05-15 NOTE — Telephone Encounter (Signed)
Cost estimate of MPI at [facility] would be $1824.88. The cost of ETT done at the [facility] office would be $191.43.  This was obtained per the cost estimating tool in Epic.

## 2021-05-15 NOTE — Telephone Encounter (Signed)
Called patient and discussed testing options as shown in the below documentation. Patient stated that since starting Losartan, and changing his diet he has not been having the chest pain. He would like to hold off on the testing for now and will notify us if he develops chest pain again.

## 2021-05-22 ENCOUNTER — Other Ambulatory Visit: Payer: Commercial Managed Care - PPO

## 2021-05-28 ENCOUNTER — Telehealth: Payer: Self-pay | Admitting: Cardiology

## 2021-05-28 NOTE — Telephone Encounter (Signed)
-----   Message from Horton Finer sent at 05/24/2021  1:11 PM EDT ----- Regarding: echo Please reschedule echocardiogram. Patient has F/U appt scheduled with Sharolyn Douglas on 06/04/21. Cancelled previously scheduled echo because he was out of town.

## 2021-05-28 NOTE — Telephone Encounter (Signed)
Attempted to schedule.  LMOV to call office.  ° °

## 2021-06-04 ENCOUNTER — Ambulatory Visit: Payer: Commercial Managed Care - PPO | Admitting: Nurse Practitioner

## 2021-07-06 ENCOUNTER — Other Ambulatory Visit: Payer: Self-pay

## 2021-07-06 ENCOUNTER — Ambulatory Visit (INDEPENDENT_AMBULATORY_CARE_PROVIDER_SITE_OTHER): Payer: Commercial Managed Care - PPO

## 2021-07-06 DIAGNOSIS — R072 Precordial pain: Secondary | ICD-10-CM | POA: Diagnosis not present

## 2021-07-06 LAB — ECHOCARDIOGRAM COMPLETE
AR max vel: 3.37 cm2
AV Area VTI: 3.73 cm2
AV Area mean vel: 3.48 cm2
AV Mean grad: 3 mmHg
AV Peak grad: 5.2 mmHg
Ao pk vel: 1.14 m/s
Area-P 1/2: 4.46 cm2
Calc EF: 50.5 %
S' Lateral: 3.1 cm
Single Plane A2C EF: 50.6 %
Single Plane A4C EF: 51.6 %

## 2021-07-13 ENCOUNTER — Ambulatory Visit: Payer: Commercial Managed Care - PPO | Admitting: Nurse Practitioner

## 2021-07-19 ENCOUNTER — Ambulatory Visit (INDEPENDENT_AMBULATORY_CARE_PROVIDER_SITE_OTHER): Payer: Commercial Managed Care - PPO | Admitting: Family Medicine

## 2021-07-19 ENCOUNTER — Other Ambulatory Visit: Payer: Self-pay

## 2021-07-19 ENCOUNTER — Encounter: Payer: Self-pay | Admitting: Family Medicine

## 2021-07-19 VITALS — BP 124/68 | HR 69 | Temp 98.0°F | Resp 16 | Ht 69.0 in | Wt 195.2 lb

## 2021-07-19 DIAGNOSIS — E785 Hyperlipidemia, unspecified: Secondary | ICD-10-CM

## 2021-07-19 DIAGNOSIS — G4733 Obstructive sleep apnea (adult) (pediatric): Secondary | ICD-10-CM

## 2021-07-19 DIAGNOSIS — R202 Paresthesia of skin: Secondary | ICD-10-CM

## 2021-07-19 DIAGNOSIS — Z114 Encounter for screening for human immunodeficiency virus [HIV]: Secondary | ICD-10-CM

## 2021-07-19 DIAGNOSIS — Z0000 Encounter for general adult medical examination without abnormal findings: Secondary | ICD-10-CM

## 2021-07-19 DIAGNOSIS — E669 Obesity, unspecified: Secondary | ICD-10-CM | POA: Diagnosis not present

## 2021-07-19 DIAGNOSIS — L989 Disorder of the skin and subcutaneous tissue, unspecified: Secondary | ICD-10-CM

## 2021-07-19 DIAGNOSIS — I10 Essential (primary) hypertension: Secondary | ICD-10-CM

## 2021-07-19 DIAGNOSIS — G629 Polyneuropathy, unspecified: Secondary | ICD-10-CM

## 2021-07-19 DIAGNOSIS — J324 Chronic pansinusitis: Secondary | ICD-10-CM

## 2021-07-19 DIAGNOSIS — Z6830 Body mass index (BMI) 30.0-30.9, adult: Secondary | ICD-10-CM

## 2021-07-19 DIAGNOSIS — G4700 Insomnia, unspecified: Secondary | ICD-10-CM | POA: Diagnosis not present

## 2021-07-19 DIAGNOSIS — Z125 Encounter for screening for malignant neoplasm of prostate: Secondary | ICD-10-CM

## 2021-07-19 DIAGNOSIS — N529 Male erectile dysfunction, unspecified: Secondary | ICD-10-CM

## 2021-07-19 DIAGNOSIS — K219 Gastro-esophageal reflux disease without esophagitis: Secondary | ICD-10-CM

## 2021-07-19 MED ORDER — TADALAFIL 20 MG PO TABS: 10.0000 mg | ORAL_TABLET | ORAL | 11 refills | Status: DC | PRN

## 2021-07-19 MED ORDER — LOSARTAN POTASSIUM 25 MG PO TABS: 25.0000 mg | ORAL_TABLET | Freq: Every day | ORAL | 1 refills | Status: DC

## 2021-07-19 MED ORDER — PANTOPRAZOLE SODIUM 20 MG PO TBEC: 20.0000 mg | DELAYED_RELEASE_TABLET | Freq: Every day | ORAL | 1 refills | Status: DC

## 2021-07-19 NOTE — Progress Notes (Signed)
Patient: Jason Duncan, Male    DOB: 1966-03-02, 55 y.o.   MRN: [id number] Delsa Grana, PA-C Visit Date: 07/19/2021  Today's Provider: Delsa Grana, PA-C   Chief Complaint  Patient presents with   Annual Exam   Subjective:   Annual physical exam:  Jason Duncan is a 55 y.o. male who presents today for health maintenance and annual & complete physical exam.   Exercise/Activity:  not as active in the past 1.5 months, usually walks his 14 acres at home with his wife but she broke  Diet/nutrition:  eats what he normally does but trying to add in salads and veggies more Sleep:  sleeps ok - sometimes uses otc sleeping pill  SDOH Screenings   Alcohol Screen: Low Risk    Last Alcohol Screening Score (AUDIT): 0  Depression (PHQ2-9): Low Risk    PHQ-2 Score: 0  Financial Resource Strain: Low Risk    Difficulty of Paying Living Expenses: Not hard at all  Food Insecurity: No Food Insecurity   Worried About Charity fundraiser in the Last Year: Never true   Ran Out of Food in the Last Year: Never true  Housing: Not on file  Physical Activity: Insufficiently Active   Days of Exercise per Week: 3 days   Minutes of Exercise per Session: 30 min  Social Connections: Moderately Integrated   Frequency of Communication with Friends and Family: More than three times a week   Frequency of Social Gatherings with Friends and Family: Once a week   Attends Religious Services: 1 to 4 times per year   Active Member of [employer] or Organizations: No   Attends Music therapist: Not on file   Marital Status: Married  Stress: Stress Concern Present   Feeling of Stress : Rather much  Tobacco Use: High Risk   Smoking Tobacco Use: Never   Smokeless Tobacco Use: Current   Passive Exposure: Not on file  Transportation Needs: No Transportation Needs   Lack of Transportation (Medical): No   Lack of Transportation (Non-Medical): No     USPSTF grade A and B recommendations - reviewed  and addressed today  Depression:  Phq 9 completed today by patient, was reviewed by me with patient in the room, score is  negative, pt feels good Depression screen [facility] 2/9 07/19/2021 04/24/2021 07/18/2020  Decreased Interest 0 2 1  Down, Depressed, Hopeless 0 - 1  PHQ - 2 Score 0 2 2  Altered sleeping 0 0 -  Tired, decreased energy 0 0 -  Change in appetite 0 0 -  Feeling bad or failure about yourself  0 0 -  Trouble concentrating 0 0 -  Moving slowly or fidgety/restless 0 0 -  Suicidal thoughts 0 0 -  PHQ-9 Score 0 2 -  Difficult doing work/chores Not difficult at all Not difficult at all -    Hep C Screening: done previously  STD testing and prevention (HIV/chl/gon/syphilis): due today and done   Intimate partner violence:safe  Advanced Care Planning:  A voluntary discussion about advance care planning including the explanation and discussion of advance directives.  Discussed health care proxy and Living will, and the patient was able to identify a health care proxy as Thurnell Lose.  Patient does not have a living will at present time. If patient does have living will, I have requested they bring this to the clinic to be scanned in to their chart.  Health Maintenance  Topic Date Due  Zoster Vaccines- Shingrix (1 of 2) 07/25/2021 (Originally 08/08/2017)   INFLUENZA VACCINE  12/14/2021 (Originally 04/16/2021)   COVID-19 Vaccine (5 - Booster for Moderna series) 08/02/2021   COLONOSCOPY (Pts 45-3yr Insurance coverage will need to be confirmed)  08/02/2022   TETANUS/TDAP  09/16/2024   Hepatitis C Screening  Completed   HIV Screening  Completed   Pneumococcal Vaccine [id number] old  Aged Out   HPV VACCINES  Aged Out    Skin cancer:   last skin survey was.  Pt reports no hx of skin cancer, suspicious lesions/biopsies in the past. Some bumps new over the past year to his chest  Colorectal cancer:  colonoscopy is due next year 3 year repeat Pt denies melena, hematochezia,  change in BM pattern or caliber    Prostate cancer:  Prostate cancer screening with PSA: Discussed risks and benefits of PSA testing and provided handout. Pt would like to have PSA drawn today. Lab Results  Component Value Date   PSA 1.0 06/28/2019   PSA 1.2 07/09/2018    Urinary Symptoms:   IPSS     Row Name 07/19/21 03833        International Prostate Symptom Score   How often have you had the sensation of not emptying your bladder? Less than 1 in 5     How often have you had to urinate less than every two hours? Less than 1 in 5 times     How often have you found you stopped and started again several times when you urinated? Less than 1 in 5 times     How often have you found it difficult to postpone urination? Less than half the time     How often have you had a weak urinary stream? Less than 1 in 5 times     How often have you had to strain to start urination? Less than 1 in 5 times     How many times did you typically get up at night to urinate? 1 Time     Total IPSS Score 8       Quality of Life due to urinary symptoms   If you were to spend the rest of your life with your urinary condition just the way it is now how would you feel about that? Mostly Satisfied            Pt states sx are stable  Lung cancer:   Low Dose CT Chest recommended if Age 54-80years, 20 pack-year currently smoking OR have quit w/in 15years. Patient does not qualify.   Social History   Tobacco Use   Smoking status: Never   Smokeless tobacco: Current    Types: Chew  Substance Use Topics   Alcohol use: No     Alcohol screening[facility] [place] 0       AAA: not indicated The USPSTF recommends one-time screening with ultrasonography in men ages 658to 754years who have ever smoked  EXOV:ANVBTYECG reviewed  Blood pressure/Hypertension: BP Readings from Last 3 Encounters:  07/19/21 124/68  04/30/21 (!)  134/94  04/24/21 (!) 144/100   Weight/Obesity: Wt Readings from Last 3 Encounters:  07/19/21 195 lb 3.2 oz (88.5 kg)  04/30/21 187 lb (84.8 kg)  04/24/21 191 lb 1.6 oz (86.7 kg)   BMI Readings from Last 3 Encounters:  07/19/21 28.83 kg/m  04/30/21 27.62 kg/m  04/24/21 29.06 kg/m  Lipids:  Lab Results  Component Value Date   CHOL 148 07/18/2020   CHOL 138 06/28/2019   CHOL 178 07/09/2018   Lab Results  Component Value Date   HDL 35 (L) 07/18/2020   HDL 36 (L) 06/28/2019   HDL 37 (L) 07/09/2018   Lab Results  Component Value Date   LDLCALC 94 07/18/2020   LDLCALC 82 06/28/2019   LDLCALC 119 (H) 07/09/2018   Lab Results  Component Value Date   TRIG 91 07/18/2020   TRIG 108 06/28/2019   TRIG 113 07/09/2018   Lab Results  Component Value Date   CHOLHDL 4.2 07/18/2020   CHOLHDL 3.8 06/28/2019   CHOLHDL 4.8 07/09/2018   No results found for: LDLDIRECT Based on the results of lipid panel his/her cardiovascular risk factor ( using [facility] )  in the next 10 years is : The 10-year ASCVD risk score (Arnett DK, et al., 2019) is: 4.9%   Values used to calculate the score:     Age: 20 years     Sex: Male     Is Non-Hispanic African American: No     Diabetic: No     Tobacco smoker: No     Systolic Blood Pressure: 323 mmHg     Is BP treated: Yes     HDL Cholesterol: 35 mg/dL     Total Cholesterol: 148 mg/dL Glucose:  Glucose  Date Value Ref Range Status  04/30/2021 86 65 - 99 mg/dL Final  07/27/2012 100 (H) 65 - 99 mg/dL Final   Glucose, Bld  Date Value Ref Range Status  04/24/2021 94 70 - 99 mg/dL Final    Comment:    Glucose reference range applies only to samples taken after fasting for at least 8 hours.  07/18/2020 92 65 - 99 mg/dL Final    Comment:    .            Fasting reference interval .   06/28/2019 108 (H) 65 - 99 mg/dL Final    Comment:    .            Fasting reference interval . For someone without known diabetes, a glucose  value between 100 and 125 mg/dL is consistent with prediabetes and should be confirmed with a follow-up test. .     Social History       Social History   Socioeconomic History   Marital status: Married    Spouse name: Sharyn Lull    Number of children: 3   Years of education: Not on file   Highest education level: Some college, no degree  Occupational History   Not on file  Tobacco Use   Smoking status: Never   Smokeless tobacco: Current    Types: Chew  Vaping Use   Vaping Use: Never used  Substance and Sexual Activity   Alcohol use: No   Drug use: No    Comment: used to smoke pot from [age] y old    Sexual activity: Not Currently  Other Topics Concern   Not on file  Social History Narrative   Not on file   Social Determinants of Health   Financial Resource Strain: Low Risk    Difficulty of Paying Living Expenses: Not hard at all  Food Insecurity: No Food Insecurity   Worried About Charity fundraiser in the Last Year: Never true   [place] in the Last Year: Never true  Transportation Needs: No Transportation Needs  Lack of Transportation (Medical): No   Lack of Transportation (Non-Medical): No  Physical Activity: Unknown   Days of Exercise per Week: 2 days   Minutes of Exercise per Session: Not on file  Stress: Stress Concern Present   Feeling of Stress : Rather much  Social Connections: Moderately Integrated   Frequency of Communication with Friends and Family: More than three times a week   Frequency of Social Gatherings with Friends and Family: Once a week   Attends Religious Services: 1 to 4 times per year   Active Member of [employer] or Organizations: No   Attends Music therapist: Not on file   Marital Status: Married    Family History        Family History  Problem Relation Age of Onset   Hypertension Mother    Pneumonia Maternal Grandmother    Cancer Maternal Grandfather        lung   Heart disease Brother        6 years  old, died from a heart attack    Patient Active Problem List   Diagnosis Date Noted   Chest pain 04/24/2021   Insomnia 06/28/2019   Tobacco chew use 06/28/2019   OSA (obstructive sleep apnea) 06/28/2019   Chronic pansinusitis 06/28/2019   Hiatal hernia with GERD 03/02/2019   Erectile dysfunction 06/24/2017   Vitamin D deficiency 06/24/2017    Past Surgical History:  Procedure Laterality Date   COLONOSCOPY WITH PROPOFOL N/A 08/03/2019   Procedure: COLONOSCOPY WITH PROPOFOL;  Surgeon: Jonathon Bellows, MD;  Location[facility] ENDOSCOPY;  Service: Gastroenterology;  Laterality: N/A;   EXTERNAL EAR SURGERY Bilateral 1973   LEG SURGERY Right 1988   rods placed due to broken leg   SHOULDER SURGERY Right 2003     Current Outpatient Medications:    fluticasone (FLONASE) 50 MCG/ACT nasal spray, Place 1 spray into both nostrils daily., Disp: , Rfl:    losartan (COZAAR) 25 MG tablet, Take 1 tablet (25 mg total) by mouth daily., Disp: 90 tablet, Rfl: 0   nitroGLYCERIN (NITROSTAT) 0.4 MG SL tablet, Place 1 tablet (0.4 mg total) under the tongue every 5 (five) minutes as needed for chest pain., Disp: 30 tablet, Rfl: 0   pantoprazole (PROTONIX) 20 MG tablet, Take 1 tablet (20 mg total) by mouth daily., Disp: 90 tablet, Rfl: 0   tadalafil (CIALIS) 20 MG tablet, Take 0.5-1 tablets (10-20 mg total) by mouth every other day as needed for erectile dysfunction., Disp: 10 tablet, Rfl: 11   metoprolol tartrate (LOPRESSOR) 100 MG tablet, Take 1 tablet (100 mg total) by mouth once for 1 dose. Take 2 hours prior to your CT scan., Disp: 1 tablet, Rfl: 0  Allergies  Allergen Reactions   Elemental Sulfur     Patient Care Team: Delsa Grana, PA-C as PCP - General (Family Medicine) Kate Sable, MD as PCP - Cardiology (Cardiology)   Chart Review: I personally reviewed active problem list, medication list, allergies, family history, social history, health maintenance, notes from last encounter, lab  results, imaging with the patient/caregiver today.   Review of Systems  Constitutional: Negative.  Negative for activity change, appetite change, fatigue and unexpected weight change.  HENT: Negative.    Eyes: Negative.   Respiratory: Negative.  Negative for shortness of breath.   Cardiovascular: Negative.  Negative for chest pain, palpitations and leg swelling.  Gastrointestinal: Negative.  Negative for abdominal pain and blood in stool.  Endocrine: Negative.   Genitourinary: Negative.  Negative  for decreased urine volume, difficulty urinating, testicular pain and urgency.  Skin: Negative.  Negative for color change and pallor.  Allergic/Immunologic: Negative.   Neurological: Negative.  Negative for syncope, weakness, light-headedness and numbness.  Psychiatric/Behavioral: Negative.  Negative for confusion, dysphoric mood, self-injury and suicidal ideas. The patient is not nervous/anxious.   All other systems reviewed and are negative.        Objective:   Vitals:  Vitals:   07/19/21 0821  BP: 124/68  Pulse: 69  Resp: 16  Temp: 98 F (36.7 C)  TempSrc: Oral  SpO2: 98%  Weight: 195 lb 3.2 oz (88.5 kg)  Height: _0  (1.753 m)    Body mass index is 28.83 kg/m.  Physical Exam Vitals and nursing note reviewed.  Constitutional:      General: He is not in acute distress.    Appearance: Normal appearance. He is well-developed and overweight. He is not ill-appearing, toxic-appearing or diaphoretic.     Interventions: Face mask in place.  HENT:     Head: Normocephalic and atraumatic.     Jaw: No trismus.     Right Ear: Hearing, tympanic membrane, ear canal and external ear normal.     Left Ear: Hearing, tympanic membrane, ear canal and external ear normal.     Nose: Septal deviation, congestion and rhinorrhea present.     Right Sinus: No maxillary sinus tenderness or frontal sinus tenderness.     Left Sinus: No maxillary sinus tenderness or frontal sinus tenderness.      Mouth/Throat:     Lips: Pink.     Mouth: Mucous membranes are moist.     Pharynx: Oropharynx is clear. Uvula midline.  Eyes:     General: Lids are normal. No scleral icterus.       Right eye: No discharge.        Left eye: No discharge.     Conjunctiva/sclera: Conjunctivae normal.  Neck:     Trachea: Trachea and phonation normal. No tracheal deviation.  Cardiovascular:     Rate and Rhythm: Normal rate and regular rhythm.     Pulses: Normal pulses.          Radial pulses are 2+ on the right side and 2+ on the left side.       Posterior tibial pulses are 2+ on the right side and 2+ on the left side.     Heart sounds: Normal heart sounds. No murmur heard.   No friction rub. No gallop.  Pulmonary:     Effort: Pulmonary effort is normal. No respiratory distress.     Breath sounds: Normal breath sounds. No stridor. No wheezing, rhonchi or rales.  Abdominal:     General: Bowel sounds are normal. There is no distension.     Palpations: Abdomen is soft.  Musculoskeletal:     Right lower leg: No edema.     Left lower leg: No edema.  Skin:    General: Skin is warm and dry.     Coloration: Skin is not jaundiced.     Findings: Lesion present. No rash.     Nails: There is no clubbing.     Comments: Oval and round 2 - 8 mm raised lesions to upper chest fleshy to light brown color   Neurological:     Mental Status: He is alert. Mental status is at baseline.     Cranial Nerves: No dysarthria or facial asymmetry.     Motor: No tremor or abnormal muscle tone.  Gait: Gait normal.  Psychiatric:        Mood and Affect: Mood normal.        Speech: Speech normal.        Behavior: Behavior normal. Behavior is cooperative.     Recent Results (from the past 2160 hour(s))  CBC with Differential/Platelet     Status: None   Collection Time: 04/24/21 11:25 AM  Result Value Ref Range   WBC 6.2 4.0 - 10.5 K/uL   RBC 4.82 4.22 - 5.81 MIL/uL   Hemoglobin 15.3 13.0 - 17.0 g/dL   HCT 42.9 39.0 -  52.0 %   MCV 89.0 80.0 - 100.0 fL   MCH 31.7 26.0 - 34.0 pg   MCHC 35.7 30.0 - 36.0 g/dL   RDW 12.0 11.5 - 15.5 %   Platelets 252 150 - 400 K/uL   nRBC 0.0 0.0 - 0.2 %   Neutrophils Relative % 64 %   Neutro Abs 3.9 1.7 - 7.7 K/uL   Lymphocytes Relative 26 %   Lymphs Abs 1.6 0.7 - 4.0 K/uL   Monocytes Relative 6 %   Monocytes Absolute 0.4 0.1 - 1.0 K/uL   Eosinophils Relative 3 %   Eosinophils Absolute 0.2 0.0 - 0.5 K/uL   Basophils Relative 1 %   Basophils Absolute 0.0 0.0 - 0.1 K/uL   Immature Granulocytes 0 %   Abs Immature Granulocytes 0.01 0.00 - 0.07 K/uL    Comment: Performed at [facility], [place], [place] [address]  Troponin I (High Sensitivity)     Status: None   Collection Time: 04/24/21 11:25 AM  Result Value Ref Range   Troponin I (High Sensitivity) 3 <18 ng/L    Comment: (NOTE) Elevated high sensitivity troponin I (hsTnI) values and significant  changes across serial measurements may suggest ACS but many other  chronic and acute conditions are known to elevate hsTnI results.  Refer to the "Links" section for chest pain algorithms and additional  guidance. Performed at [facility], [address] [place], [place] [address]   Comprehensive metabolic panel     Status: Abnormal   Collection Time: 04/24/21 11:25 AM  Result Value Ref Range   Sodium 141 135 - 145 mmol/L   Potassium 3.4 (L) 3.5 - 5.1 mmol/L   Chloride 104 98 - 111 mmol/L   CO2 26 22 - 32 mmol/L   Glucose, Bld 94 70 - 99 mg/dL    Comment: Glucose reference range applies only to samples taken after fasting for at least 8 hours.   BUN 15 6 - 20 mg/dL   Creatinine, Ser 0.87 0.61 - 1.24 mg/dL   Calcium 9.7 8.9 - 10.3 mg/dL   Total Protein 7.5 6.5 - 8.1 g/dL   Albumin 4.7 3.5 - 5.0 g/dL   AST 17 15 - 41 U/L   ALT 13 0 - 44 U/L   Alkaline Phosphatase 49 38 - 126 U/L   Total Bilirubin 1.1 0.3 - 1.2 mg/dL   GFR, Estimated >60 >60 mL/min    Comment:  (NOTE) Calculated using the CKD-EPI Creatinine Equation (2021)    Anion gap 11 5 - 15    Comment: Performed at [facility], [address] [place], [place] [address]  Basic metabolic panel     Status: None   Collection Time: 04/30/21  3:52 PM  Result Value Ref Range   Glucose 86 65 - 99 mg/dL   BUN 19 6 - 24 mg/dL  Creatinine, Ser 0.94 0.76 - 1.27 mg/dL   eGFR 97 >59 mL/min/1.73   BUN/Creatinine Ratio 20 9 - 20   Sodium 144 134 - 144 mmol/L   Potassium 4.2 3.5 - 5.2 mmol/L   Chloride 106 96 - 106 mmol/L   CO2 23 20 - 29 mmol/L   Calcium 10.1 8.7 - 10.2 mg/dL  ECHOCARDIOGRAM COMPLETE     Status: None   Collection Time: 07/06/21  8:15 AM  Result Value Ref Range   AR max vel 3.37 cm2   AV Peak grad 5.2 mmHg   Ao pk vel 1.14 m/s   S' Lateral 3.10 cm   Area-P 1/2 4.46 cm2   AV Area VTI 3.73 cm2   AV Mean grad 3.0 mmHg   Single Plane A4C EF 51.6 %   Single Plane A2C EF 50.6 %   Calc EF 50.5 %   AV Area mean vel 3.48 cm2    Fall Risk: Fall Risk  07/19/2021 04/24/2021 07/18/2020 03/16/2019 03/02/2019  Falls in the past year? 0 0 0 0 0  Number falls in past yr: 0 0 0 0 0  Injury with Fall? 0 0 0 0 -  Risk for fall due to : No Fall Risks - - - -  Follow up Falls prevention discussed Falls evaluation completed Falls evaluation completed - -    Functional Status Survey: Is the patient deaf or have difficulty hearing?: No Does the patient have difficulty seeing, even when wearing glasses/contacts?: No Does the patient have difficulty concentrating, remembering, or making decisions?: No Does the patient have difficulty walking or climbing stairs?: No Does the patient have difficulty dressing or bathing?: No Does the patient have difficulty doing errands alone such as visiting a doctor's office or shopping?: No   Assessment & Plan:    CPE completed today  Prostate cancer screening and PSA options (with potential risks and benefits of testing vs not testing) were  discussed along with recent recs/guidelines, shared decision making and handout/information given to pt today  USPSTF grade A and B recommendations reviewed with patient; age-appropriate recommendations, preventive care, screening tests, etc discussed and encouraged; healthy living encouraged; see AVS for patient education given to patient  Discussed importance of 150 minutes of physical activity weekly, AHA exercise recommendations given to pt in AVS/handout  Discussed importance of healthy diet:  eating lean meats and proteins, avoiding trans fats and saturated fats, avoid simple sugars and excessive carbs in diet, eat 6 servings of fruit/vegetables daily and drink plenty of water and avoid sweet beverages.  DASH diet reviewed if pt has HTN  Recommended pt to do annual eye exam and routine dental exams/cleanings  Advance Care planning information and packet discussed and offered today, encouraged pt to discuss with family members/spouse/partner/friends and complete Advanced directive packet and bring copy to office   Reviewed Health Maintenance: Health Maintenance  Topic Date Due   Zoster Vaccines- Shingrix (1 of 2) 07/25/2021 (Originally 08/08/2017)   INFLUENZA VACCINE  12/14/2021 (Originally 04/16/2021)   COVID-19 Vaccine (5 - Booster for Moderna series) 08/02/2021   COLONOSCOPY (Pts 45-58yr Insurance coverage will need to be confirmed)  08/02/2022   TETANUS/TDAP  09/16/2024   Hepatitis C Screening  Completed   HIV Screening  Completed   Pneumococcal Vaccine [id number] old  Aged Out   HPV VACCINES  Aged Out    Immunizations: Immunization History  Administered Date(s) Administered   Influenza-Unspecified 06/18/2019, 07/06/2020   Moderna Sars-Covid-2 Vaccination 09/27/2019, 10/25/2019, 07/28/2020,  06/07/2021   Vaccines:  HPV: up to at age 80 , ask insurance if age between 62-45  Shingrix: 76-64 yo and ask insurance if covered when patient above 74 yo Pneumonia:  educated and  discussed with patient. Flu:  educated and discussed with patient. COVID:       ICD-10-CM   1. Adult general medical exam  Z00.00 Hemoglobin A1c    Lipid panel    TSH    PSA    HIV antibody (with reflex)    COMPLETE METABOLIC PANEL WITH GFR    2. Insomnia, unspecified type  G47.00     3. Hyperlipidemia, unspecified hyperlipidemia type  E78.5 Lipid panel    COMPLETE METABOLIC PANEL WITH GFR   recheck    4. Class 1 obesity with body mass index (BMI) of 30.0 to 30.9 in adult, unspecified obesity type, unspecified whether serious comorbidity present  E66.9 Hemoglobin A1c   Z68.30 Lipid panel    TSH    COMPLETE METABOLIC PANEL WITH GFR   improved, pt lost weight    5. Erectile dysfunction, unspecified erectile dysfunction type  N52.9 tadalafil (CIALIS) 20 MG tablet   he would like to try different meds- cialis rx for him, he's tried in the past    6. Screening for prostate cancer  Z12.5 PSA    7. Screening for HIV without presence of risk factors  Z11.4 HIV antibody (with reflex)    8. Skin lesion  L98.9 Ambulatory referral to Dermatology    9. Primary hypertension  I10 losartan (COZAAR) 25 MG tablet    COMPLETE METABOLIC PANEL WITH GFR   BP at goal on new meds - losartan 25 mg - refills sent in    10. Gastroesophageal reflux disease, unspecified whether esophagitis present  K21.9 pantoprazole (PROTONIX) 20 MG tablet          Delsa Grana, PA-C 07/19/21 8:51 AM  [facility]

## 2021-07-19 NOTE — Patient Instructions (Addendum)
Health Maintenance  Topic Date Due   Zoster (Shingles) Vaccine (1 of 2) 07/25/2021*   Flu Shot  12/14/2021*   COVID-19 Vaccine (5 - Booster for Moderna series) 08/02/2021   Colon Cancer Screening  08/02/2022   Tetanus Vaccine  09/16/2024   Hepatitis C Screening: USPSTF Recommendation to screen - Ages 18-54 yo.  Completed   HIV Screening  Completed   Pneumococcal Vaccination  Aged Out   HPV Vaccine  Aged Out  *Topic was postponed. The date shown is not the original due date.     Preventive Care 73-73 Years Old, Male Preventive care refers to lifestyle choices and visits with your health care provider that can promote health and wellness. This includes: A yearly physical exam. This is also called an annual wellness visit. Regular dental and eye exams. Immunizations. Screening for certain conditions. Healthy lifestyle choices, such as: Eating a healthy diet. Getting regular exercise. Not using drugs or products that contain nicotine and tobacco. Limiting alcohol use. What can I expect for my preventive care visit? Physical exam Your health care provider will check your: Height and weight. These may be used to calculate your BMI (body mass index). BMI is a measurement that tells if you are at a healthy weight. Heart rate and blood pressure. Body temperature. Skin for abnormal spots. Counseling Your health care provider may ask you questions about your: Past medical problems. Family's medical history. Alcohol, tobacco, and drug use. Emotional well-being. Home life and relationship well-being. Sexual activity. Diet, exercise, and sleep habits. Work and work Statistician. Access to firearms. What immunizations do I need? Vaccines are usually given at various ages, according to a schedule. Your health care provider will recommend vaccines for you based on your age, medical history, and lifestyle or other factors, such as travel or where you work. What tests do I need? Blood  tests Lipid and cholesterol levels. These may be checked every 5 years, or more often if you are over 51 years old. Hepatitis C test. Hepatitis B test. Screening Lung cancer screening. You may have this screening every year starting at age 53 if you have a 30-pack-year history of smoking and currently smoke or have quit within the past 15 years. Prostate cancer screening. Recommendations will vary depending on your family history and other risks. Genital exam to check for testicular cancer or hernias. Colorectal cancer screening. All adults should have this screening starting at age 5 and continuing until age 4. Your health care provider may recommend screening at age 54 if you are at increased risk. You will have tests every 1-10 years, depending on your results and the type of screening test. Diabetes screening. This is done by checking your blood sugar (glucose) after you have not eaten for a while (fasting). You may have this done every 1-3 years. STD (sexually transmitted disease) testing, if you are at risk. Follow these instructions at home: Eating and drinking  Eat a diet that includes fresh fruits and vegetables, whole grains, lean protein, and low-fat dairy products. Take vitamin and mineral supplements as recommended by your health care provider. Do not drink alcohol if your health care provider tells you not to drink. If you drink alcohol: Limit how much you have to 0-2 drinks a day. Be aware of how much alcohol is in your drink. In the [place] one drink equals one 12 oz bottle of beer (355 mL), one 5 oz glass of wine (148 mL), or one 1 oz glass of hard liquor (  44 mL). Lifestyle Take daily care of your teeth and gums. Brush your teeth every morning and night with fluoride toothpaste. Floss one time each day. Stay active. Exercise for at least 30 minutes 5 or more days each week. Do not use any products that contain nicotine or tobacco, such as cigarettes, e-cigarettes, and  chewing tobacco. If you need help quitting, ask your health care provider. Do not use drugs. If you are sexually active, practice safe sex. Use a condom or other form of protection to prevent STIs (sexually transmitted infections). If told by your health care provider, take low-dose aspirin daily starting at age 81. Find healthy ways to cope with stress, such as: Meditation, yoga, or listening to music. Journaling. Talking to a trusted person. Spending time with friends and family. Safety Always wear your seat belt while driving or riding in a vehicle. Do not drive: If you have been drinking alcohol. Do not ride with someone who has been drinking. When you are tired or distracted. While texting. Wear a helmet and other protective equipment during sports activities. If you have firearms in your house, make sure you follow all gun safety procedures. What's next? Go to your health care provider once a year for an annual wellness visit. Ask your health care provider how often you should have your eyes and teeth checked. Stay up to date on all vaccines. This information is not intended to replace advice given to you by your health care provider. Make sure you discuss any questions you have with your health care provider. Document Revised: 11/10/2020 Document Reviewed: 08/27/2018 Elsevier Patient Education  2022 [employer].

## 2021-07-20 LAB — COMPLETE METABOLIC PANEL WITH GFR
AG Ratio: 2.1 (calc) (ref 1.0–2.5)
ALT: 13 U/L (ref 9–46)
AST: 15 U/L (ref 10–35)
Albumin: 4.7 g/dL (ref 3.6–5.1)
Alkaline phosphatase (APISO): 62 U/L (ref 35–144)
BUN: 19 mg/dL (ref 7–25)
CO2: 26 mmol/L (ref 20–32)
Calcium: 9.9 mg/dL (ref 8.6–10.3)
Chloride: 107 mmol/L (ref 98–110)
Creat: 0.91 mg/dL (ref 0.70–1.30)
Globulin: 2.2 g/dL (calc) (ref 1.9–3.7)
Glucose, Bld: 80 mg/dL (ref 65–99)
Potassium: 4.6 mmol/L (ref 3.5–5.3)
Sodium: 142 mmol/L (ref 135–146)
Total Bilirubin: 0.4 mg/dL (ref 0.2–1.2)
Total Protein: 6.9 g/dL (ref 6.1–8.1)
eGFR: 101 mL/min/{1.73_m2} (ref >=60)

## 2021-07-20 LAB — TSH: TSH: 2.11 mIU/L (ref 0.40–4.50)

## 2021-07-20 LAB — HIV ANTIBODY (ROUTINE TESTING W REFLEX): HIV 1&2 Ab, 4th Generation: NONREACTIVE

## 2021-07-20 LAB — HEMOGLOBIN A1C
Hgb A1c MFr Bld: 5.3 % of total Hgb (ref <5.7)
Mean Plasma Glucose: 105 mg/dL
eAG (mmol/L): 5.8 mmol/L

## 2021-07-20 LAB — LIPID PANEL
Cholesterol: 153 mg/dL (ref <200)
HDL: 38 mg/dL — ABNORMAL LOW (ref >=40)
LDL Cholesterol (Calc): 99 mg/dL (calc)
Non-HDL Cholesterol (Calc): 115 mg/dL (calc) (ref <130)
Total CHOL/HDL Ratio: 4 (calc) (ref <5.0)
Triglycerides: 74 mg/dL (ref <150)

## 2021-07-20 LAB — PSA: PSA: 1.05 ng/mL (ref <=4.00)

## 2021-09-19 ENCOUNTER — Telehealth: Payer: Self-pay

## 2021-09-19 NOTE — Telephone Encounter (Signed)
Left pharmacy vm

## 2021-09-19 NOTE — Telephone Encounter (Signed)
Copied from [place] [id number]. Topic: General - Other >> Sep 19, 2021  1:36 PM Jason Duncan wrote: Reason for CRM: Pt called back to report that [facility] is not letting him have his [place]. Wants PCP to intervene.  Best contact: [name] [address] [name] Phone: [phone] Fax: [phone]

## 2021-12-20 ENCOUNTER — Encounter: Payer: Self-pay | Admitting: Family Medicine

## 2021-12-20 ENCOUNTER — Ambulatory Visit: Payer: Commercial Managed Care - PPO | Admitting: Family Medicine

## 2021-12-20 VITALS — BP 138/78 | HR 77 | Temp 98.1°F | Resp 16 | Ht 69.0 in | Wt 197.0 lb

## 2021-12-20 DIAGNOSIS — J029 Acute pharyngitis, unspecified: Secondary | ICD-10-CM

## 2021-12-20 DIAGNOSIS — R1310 Dysphagia, unspecified: Secondary | ICD-10-CM

## 2021-12-20 DIAGNOSIS — Z720 Tobacco use: Secondary | ICD-10-CM

## 2021-12-20 DIAGNOSIS — E010 Iodine-deficiency related diffuse (endemic) goiter: Secondary | ICD-10-CM

## 2021-12-20 DIAGNOSIS — R5383 Other fatigue: Secondary | ICD-10-CM

## 2021-12-20 DIAGNOSIS — I10 Essential (primary) hypertension: Secondary | ICD-10-CM

## 2021-12-20 DIAGNOSIS — M542 Cervicalgia: Secondary | ICD-10-CM | POA: Diagnosis not present

## 2021-12-20 DIAGNOSIS — R051 Acute cough: Secondary | ICD-10-CM

## 2021-12-20 MED ORDER — LOSARTAN POTASSIUM 25 MG PO TABS: 25.0000 mg | ORAL_TABLET | Freq: Every day | ORAL | 1 refills | Status: DC

## 2021-12-20 NOTE — Progress Notes (Signed)
? ? ?Patient ID: Jason Duncan, male    DOB: 05-Jul-1966, 55 y.o.   MRN: [id number] ? ?PCP: Delsa Grana, PA-C ? ?Chief Complaint  ?Patient presents with  ? Cyst  ?  On left side of throat, feels pain. Onset for a month pt concerned it may be some type of cancer.  ? ? ?Subjective:  ? ?Jason Duncan is a 56 y.o. male, presents to clinic with CC of the following: ? ?HPI  ?Sore throat/neck onset over a month ago in one spot ?Some cough recently developed ?Fatigue beginning ?Mild decrease in appetite  ? ? ?Wt Readings from Last 5 Encounters:  ?12/20/21 197 lb (89.4 kg)  ?07/19/21 195 lb 3.2 oz (88.5 kg)  ?04/30/21 187 lb (84.8 kg)  ?04/24/21 191 lb 1.6 oz (86.7 kg)  ?07/18/20 197 lb 4.8 oz (89.5 kg)  ? ?BMI Readings from Last 5 Encounters:  ?12/20/21 29.09 kg/m?  ?07/19/21 28.83 kg/m?  ?04/30/21 27.62 kg/m?  ?04/24/21 29.06 kg/m?  ?07/18/20 30.00 kg/m?  ? ?BP Readings from Last 3 Encounters:  ?12/20/21 138/78  ?07/19/21 124/68  ?04/30/21 (!) 134/94  ? ? ? ? ?Patient Active Problem List  ? Diagnosis Date Noted  ? Chest pain 04/24/2021  ? Insomnia 06/28/2019  ? Tobacco chew use 06/28/2019  ? OSA (obstructive sleep apnea) 06/28/2019  ? Chronic pansinusitis 06/28/2019  ? Hiatal hernia with GERD 03/02/2019  ? Erectile dysfunction 06/24/2017  ? Vitamin D deficiency 06/24/2017  ? ? ? ? ?Current Outpatient Medications:  ?  fluticasone (FLONASE) 50 MCG/ACT nasal spray, Place 1 spray into both nostrils daily., Disp: , Rfl:  ?  losartan (COZAAR) 25 MG tablet, Take 1 tablet (25 mg total) by mouth daily., Disp: 90 tablet, Rfl: 1 ?  pantoprazole (PROTONIX) 20 MG tablet, Take 1 tablet (20 mg total) by mouth daily., Disp: 90 tablet, Rfl: 1 ?  tadalafil (CIALIS) 20 MG tablet, Take 0.5-1 tablets (10-20 mg total) by mouth every other day as needed for erectile dysfunction., Disp: 10 tablet, Rfl: 11 ? ? ?Allergies  ?Allergen Reactions  ? Elemental Sulfur   ? ? ? ?Social History  ? ?Tobacco Use  ? Smoking status: Never  ? Smokeless  tobacco: Current  ?  Types: Chew  ?Vaping Use  ? Vaping Use: Never used  ?Substance Use Topics  ? Alcohol use: No  ? Drug use: No  ?  Comment: used to smoke pot from [age] y old   ?  ? ? ?Chart Review Today: ?I personally reviewed active problem list, medication list, allergies, family history, social history, health maintenance, notes from last encounter, lab results, imaging with the patient/caregiver today. ? ? ?Review of Systems  ?Constitutional: Negative.   ?HENT: Negative.    ?Eyes: Negative.   ?Respiratory: Negative.    ?Cardiovascular: Negative.   ?Gastrointestinal: Negative.   ?Endocrine: Negative.   ?Genitourinary: Negative.   ?Musculoskeletal: Negative.   ?Skin: Negative.   ?Allergic/Immunologic: Negative.   ?Neurological: Negative.   ?Hematological: Negative.   ?Psychiatric/Behavioral: Negative.    ?All other systems reviewed and are negative. ? ?   ?Objective:  ? ?Vitals:  ? 12/20/21 1549  ?BP: 138/78  ?Pulse: 77  ?Resp: 16  ?Temp: 98.1 ?F (36.7 ?C)  ?TempSrc: Oral  ?SpO2: 98%  ?Weight: 197 lb (89.4 kg)  ?Height: $RemoveB'5\' 9"'gsWILnVc$  (1.753 m)  ?  ?Body mass index is 29.09 kg/m?. ? ?Physical Exam ?Vitals and nursing note reviewed.  ?Constitutional:   ?   General: He  is not in acute distress. ?   Appearance: He is well-developed, well-groomed and overweight. He is not ill-appearing, toxic-appearing or diaphoretic.  ?   Interventions: Face mask in place.  ?   Comments: Appears tired but non-toxic   ?HENT:  ?   Head: Normocephalic and atraumatic.  ?   Salivary Glands: Right salivary gland is not diffusely enlarged or tender. Left salivary gland is not diffusely enlarged or tender.  ?   Right Ear: Tympanic membrane, ear canal and external ear normal.  ?   Left Ear: Tympanic membrane, ear canal and external ear normal.  ?   Nose: Septal deviation, mucosal edema, congestion and rhinorrhea present.  ?   Right Turbinates: Enlarged and swollen.  ?   Left Turbinates: Enlarged.  ?   Right Sinus: No maxillary sinus tenderness or  frontal sinus tenderness.  ?   Left Sinus: No maxillary sinus tenderness or frontal sinus tenderness.  ?   Mouth/Throat:  ?   Mouth: Mucous membranes are moist. No oral lesions.  ?   Dentition: Has dentures. No gum lesions.  ?   Tongue: No lesions. Tongue does not deviate from midline.  ?   Palate: No mass and lesions.  ?   Pharynx: Uvula midline. No pharyngeal swelling, oropharyngeal exudate, posterior oropharyngeal erythema or uvula swelling.  ?   Tonsils: No tonsillar exudate or tonsillar abscesses. 0 on the right. 0 on the left.  ?Neck:  ?   Thyroid: Thyromegaly (very minor, able to palpate easily) present. No thyroid mass or thyroid tenderness.  ?   Trachea: Phonation normal. No tracheal deviation.  ? ?   Comments: Area of ttp circled, no mass or nodule in that location ?Cardiovascular:  ?   Rate and Rhythm: Normal rate and regular rhythm.  ?   Pulses: Normal pulses.  ?Pulmonary:  ?   Effort: Pulmonary effort is normal. No respiratory distress.  ?   Breath sounds: Normal breath sounds. No stridor. No wheezing, rhonchi or rales.  ?Abdominal:  ?   General: Bowel sounds are normal.  ?   Palpations: Abdomen is soft.  ?Musculoskeletal:  ?   Cervical back: Full passive range of motion without pain and normal range of motion. No edema, erythema or torticollis. No spinous process tenderness or muscular tenderness.  ?   Right lower leg: No edema.  ?   Left lower leg: No edema.  ?Lymphadenopathy:  ?   Cervical: No cervical adenopathy.  ?   Right cervical: No superficial, deep or posterior cervical adenopathy. ?   Left cervical: No superficial or deep cervical adenopathy.  ?Skin: ?   General: Skin is warm and dry.  ?   Coloration: Skin is not jaundiced or pale.  ?   Findings: No lesion or rash.  ?Neurological:  ?   Mental Status: He is alert.  ?   Cranial Nerves: No dysarthria or facial asymmetry.  ?   Gait: Gait normal.  ?Psychiatric:     ?   Mood and Affect: Mood normal.     ?   Behavior: Behavior normal. Behavior is  cooperative.  ?  ? ?Results for orders placed or performed in visit on 07/19/21  ?Hemoglobin A1c  ?Result Value Ref Range  ? Hgb A1c MFr Bld 5.3 <5.7 % of total Hgb  ? Mean Plasma Glucose 105 mg/dL  ? eAG (mmol/L) 5.8 mmol/L  ?Lipid panel  ?Result Value Ref Range  ? Cholesterol 153 <200 mg/dL  ? HDL  38 (L) > OR = 40 mg/dL  ? Triglycerides 74 <150 mg/dL  ? LDL Cholesterol (Calc) 99 mg/dL (calc)  ? Total CHOL/HDL Ratio 4.0 <5.0 (calc)  ? Non-HDL Cholesterol (Calc) 115 <130 mg/dL (calc)  ?TSH  ?Result Value Ref Range  ? TSH 2.11 0.40 - 4.50 mIU/L  ?PSA  ?Result Value Ref Range  ? PSA 1.05 < OR = 4.00 ng/mL  ?HIV antibody (with reflex)  ?Result Value Ref Range  ? HIV 1&2 Ab, 4th Generation NON-REACTIVE NON-REACTIVE  ?COMPLETE METABOLIC PANEL WITH GFR  ?Result Value Ref Range  ? Glucose, Bld 80 65 - 99 mg/dL  ? BUN 19 7 - 25 mg/dL  ? Creat 0.91 0.70 - 1.30 mg/dL  ? eGFR 101 > OR = 60 mL/min/1.71m2  ? BUN/Creatinine Ratio NOT APPLICABLE 6 - 22 (calc)  ? Sodium 142 135 - 146 mmol/L  ? Potassium 4.6 3.5 - 5.3 mmol/L  ? Chloride 107 98 - 110 mmol/L  ? CO2 26 20 - 32 mmol/L  ? Calcium 9.9 8.6 - 10.3 mg/dL  ? Total Protein 6.9 6.1 - 8.1 g/dL  ? Albumin 4.7 3.6 - 5.1 g/dL  ? Globulin 2.2 1.9 - 3.7 g/dL (calc)  ? AG Ratio 2.1 1.0 - 2.5 (calc)  ? Total Bilirubin 0.4 0.2 - 1.2 mg/dL  ? Alkaline phosphatase (APISO) 62 35 - 144 U/L  ? AST 15 10 - 35 U/L  ? ALT 13 9 - 46 U/L  ? ? ?   ?Assessment & Plan:  ? ?1. Tobacco chew use ?Risk for development of CA - he is concerned - discussed the risk and work up - if sx persist and work up neg will have him see ENT or get CT ?- DG ESOPHAGUS W SINGLE CM (SOL OR THIN BA) ? ?2. Sore throat ?Ongoing for over a month with referred left ear pain ?No known sick contacts ?- CBC with Differential/Platelet ?- COMPLETE METABOLIC PANEL WITH GFR ?- Aerobic culture ?- DG ESOPHAGUS W SINGLE CM (SOL OR THIN BA) ?- US SOFT TISSUE HEAD & NECK (NON-THYROID) ?- US THYROID ? ?3. Neck pain ?Ttp w/o obvious or  palpable mass, left  ?- CBC with Differential/Platelet ?- COMPLETE METABOLIC PANEL WITH GFR ?- DG ESOPHAGUS W SINGLE CM (SOL OR THIN BA) ?- US SOFT TISSUE HEAD & NECK (NON-THYROID) ?- US THYROID ? ?4. Dysphag

## 2021-12-23 LAB — CBC WITH DIFFERENTIAL/PLATELET
Absolute Monocytes: 503 cells/uL (ref 200–950)
Basophils Absolute: 40 cells/uL (ref 0–200)
Basophils Relative: 0.6 %
Eosinophils Absolute: 27 cells/uL (ref 15–500)
Eosinophils Relative: 0.4 %
HCT: 42.8 % (ref 38.5–50.0)
Hemoglobin: 14.5 g/dL (ref 13.2–17.1)
Lymphs Abs: 1655 cells/uL (ref 850–3900)
MCH: 30.7 pg (ref 27.0–33.0)
MCHC: 33.9 g/dL (ref 32.0–36.0)
MCV: 90.7 fL (ref 80.0–100.0)
MPV: 9.4 fL (ref 7.5–12.5)
Monocytes Relative: 7.5 %
Neutro Abs: 4476 cells/uL (ref 1500–7800)
Neutrophils Relative %: 66.8 %
Platelets: 294 10*3/uL (ref 140–400)
RBC: 4.72 10*6/uL (ref 4.20–5.80)
RDW: 12.3 % (ref 11.0–15.0)
Total Lymphocyte: 24.7 %
WBC: 6.7 10*3/uL (ref 3.8–10.8)

## 2021-12-23 LAB — COMPLETE METABOLIC PANEL WITH GFR
AG Ratio: 2 (calc) (ref 1.0–2.5)
ALT: 13 U/L (ref 9–46)
AST: 16 U/L (ref 10–35)
Albumin: 4.7 g/dL (ref 3.6–5.1)
Alkaline phosphatase (APISO): 64 U/L (ref 35–144)
BUN: 18 mg/dL (ref 7–25)
CO2: 26 mmol/L (ref 20–32)
Calcium: 9.7 mg/dL (ref 8.6–10.3)
Chloride: 108 mmol/L (ref 98–110)
Creat: 0.95 mg/dL (ref 0.70–1.30)
Globulin: 2.4 g/dL (calc) (ref 1.9–3.7)
Glucose, Bld: 79 mg/dL (ref 65–99)
Potassium: 3.9 mmol/L (ref 3.5–5.3)
Sodium: 142 mmol/L (ref 135–146)
Total Bilirubin: 0.3 mg/dL (ref 0.2–1.2)
Total Protein: 7.1 g/dL (ref 6.1–8.1)
eGFR: 95 mL/min/{1.73_m2} (ref >=60)

## 2021-12-23 LAB — AEROBIC CULTURE
MICRO NUMBER:: 13236117
SPECIMEN QUALITY:: ADEQUATE

## 2021-12-23 LAB — TSH: TSH: 1.22 mIU/L (ref 0.40–4.50)

## 2021-12-25 ENCOUNTER — Telehealth: Payer: Self-pay

## 2021-12-25 NOTE — Telephone Encounter (Signed)
Called pt no answer left vm to call back office. ?

## 2021-12-25 NOTE — Telephone Encounter (Signed)
Copied from [place] [phone]. Topic: General - Other >> Dec 25, 2021  9:40 AM Tessa Lerner A wrote: Reason for CRM: The patient would like to speak with a member of clinical staff about their recent lab results from 12/20/21  Please contact further

## 2021-12-25 NOTE — Telephone Encounter (Signed)
Pt called back and is wanting to know about his culture result. I advised pt, provider has not reviewed it yet we will give him a call once provider reviews the result. Please advice ?

## 2021-12-26 ENCOUNTER — Other Ambulatory Visit: Payer: Self-pay | Admitting: Family Medicine

## 2021-12-26 MED ORDER — AMOXICILLIN 500 MG PO TABS: 500.0000 mg | ORAL_TABLET | Freq: Two times a day (BID) | ORAL | 0 refills | Status: AC

## 2021-12-27 ENCOUNTER — Ambulatory Visit: Payer: Self-pay

## 2021-12-27 LAB — AEROBIC CULTURE

## 2021-12-27 NOTE — Telephone Encounter (Signed)
?  Chief Complaint: needed test results ?Symptoms: sore throat - strep+ ?Frequency:  ?Pertinent Negatives: Patient denies Throat still hurts ?Disposition: '[]'$ ED /'[]'$ Urgent Care (no appt availability in office) / '[]'$ Appointment(In office/virtual)/ '[]'$  [facility]/ '[]'$ Home Care/ '[]'$ Refused Recommended Disposition /'[]'$ [facility]/ '[]'$  Follow-up with PCP ?Additional Notes: Shared provider's note with PT.  No further questions. ?Pt was unable to get medication yesterday - will pick up today. ? ?Result Care Coordination ? ? ?Result Notes ? Delsa Grana, PA-C  ?12/26/2021  4:54 PM EDT Back to Top  ?  ?His throat culture was positive for Group C strep ( we have a lot of strep going around in very atypical ways) - we will need to treat with 10 d antibiotics - hopefully this will improve most of his symptoms. ?If his symptoms do not improve then I recommend proceeding with all the swallowing and imaging studies.   ? ?  ? ?Patient Communication ? Append Comments   Seen Back to Top  ? Jason Duncan -  ?  ?I just saw your throat culture results which were posittive for strep C. I sent in an antibiotic to your pharmacy.  Start taking and complete all pills.  ...  ?Written by Delsa Grana, PA-C on 12/26/2021  4:54 PM EDT View Full Comments ? ?Reason for Disposition ? Health Information question, no triage required and triager able to answer question ? ?Answer Assessment - Initial Assessment Questions ?1. REASON FOR CALL or QUESTION: "What is your reason for calling today?" or "How can I best help you?" or "What question do you have that I can help answer?" ?    Lab results ? ?Protocols used: Information Only Call - No Triage-A-AH ? ?

## 2021-12-28 ENCOUNTER — Ambulatory Visit: Payer: Commercial Managed Care - PPO

## 2022-01-15 ENCOUNTER — Ambulatory Visit
Admission: RE | Admit: 2022-01-15 | Discharge: 2022-01-15 | Disposition: A | Discharge status: Home / Self Care | Payer: Commercial Managed Care - PPO | Source: Ambulatory Visit | Attending: Family Medicine | Admitting: Family Medicine

## 2022-01-15 ENCOUNTER — Other Ambulatory Visit: Payer: Commercial Managed Care - PPO

## 2022-01-15 DIAGNOSIS — Z720 Tobacco use: Secondary | ICD-10-CM | POA: Insufficient documentation

## 2022-01-15 DIAGNOSIS — M542 Cervicalgia: Secondary | ICD-10-CM | POA: Insufficient documentation

## 2022-01-15 DIAGNOSIS — J029 Acute pharyngitis, unspecified: Secondary | ICD-10-CM | POA: Insufficient documentation

## 2022-01-15 DIAGNOSIS — R1310 Dysphagia, unspecified: Secondary | ICD-10-CM | POA: Insufficient documentation

## 2022-01-15 DIAGNOSIS — E010 Iodine-deficiency related diffuse (endemic) goiter: Secondary | ICD-10-CM | POA: Insufficient documentation

## 2022-01-15 DIAGNOSIS — R5383 Other fatigue: Secondary | ICD-10-CM | POA: Diagnosis present

## 2022-01-17 ENCOUNTER — Ambulatory Visit: Payer: Commercial Managed Care - PPO | Admitting: Family Medicine

## 2022-01-17 NOTE — Progress Notes (Signed)
Appt made

## 2022-01-22 ENCOUNTER — Ambulatory Visit: Payer: Commercial Managed Care - PPO | Admitting: Family Medicine

## 2022-01-28 ENCOUNTER — Encounter: Payer: Self-pay | Admitting: Family Medicine

## 2022-01-28 ENCOUNTER — Ambulatory Visit: Payer: Commercial Managed Care - PPO | Admitting: Family Medicine

## 2022-01-28 VITALS — BP 126/70 | HR 73 | Resp 16 | Ht 69.0 in | Wt 194.0 lb

## 2022-01-28 DIAGNOSIS — R1310 Dysphagia, unspecified: Secondary | ICD-10-CM

## 2022-01-28 DIAGNOSIS — K449 Diaphragmatic hernia without obstruction or gangrene: Secondary | ICD-10-CM

## 2022-01-28 DIAGNOSIS — K219 Gastro-esophageal reflux disease without esophagitis: Secondary | ICD-10-CM | POA: Diagnosis not present

## 2022-01-28 MED ORDER — FAMOTIDINE 20 MG PO TABS: 20.0000 mg | ORAL_TABLET | Freq: Two times a day (BID) | ORAL | 1 refills | Status: DC | PRN

## 2022-01-28 MED ORDER — PANTOPRAZOLE SODIUM 40 MG PO TBEC: 40.0000 mg | DELAYED_RELEASE_TABLET | Freq: Two times a day (BID) | ORAL | 3 refills | Status: DC

## 2022-01-28 MED ORDER — SUCRALFATE 1 G PO TABS
1.0000 g | ORAL_TABLET | Freq: Three times a day (TID) | ORAL | 2 refills | Status: DC
Start: 2022-01-28 — End: 2022-07-22

## 2022-01-28 NOTE — Progress Notes (Signed)
Patient ID: Jason Duncan, male    DOB: October 20, 1965, 56 y.o.   MRN: [id number]  PCP: Delsa Grana, PA-C  Chief Complaint  Patient presents with   Follow-up    Subjective:   Jason Duncan is a 56 y.o. male, presents to clinic with CC of the following:  Pt here for f/up on neck pain, was + for strep, treated, some of his sx resolved but not all of them, states now that it is reflux causing burning in his neck and throat.   Reviewed his imaging done -  He reports "messed up" stomach ever since doing barium swallow studies - it showed moderate reflux He's on meds but he says they dont help, hasn't been to GI recently  GERD: - Current medication regimen: protonix 20  - Possible Triggers: no or minimal alcohol, caffeine use is moderate-to-high daily couple regular sodas a day, using NSAID's regularly-BC and ibuprofen,  large meals, lying down, and tomato sauce - Endorses: cough or dysphagia - Denies: abdominal pain, chest pain, wheezing, weight loss, black stools, hematemesis, diarrhea, constipation, history of PUD, or history of GI bleeding  Patient Active Problem List   Diagnosis Date Noted   Chest pain 04/24/2021   Insomnia 06/28/2019   Tobacco chew use 06/28/2019   OSA (obstructive sleep apnea) 06/28/2019   Chronic pansinusitis 06/28/2019   Hiatal hernia with GERD 03/02/2019   Erectile dysfunction 06/24/2017   Vitamin D deficiency 06/24/2017      Current Outpatient Medications:    losartan (COZAAR) 25 MG tablet, Take 1 tablet (25 mg total) by mouth daily., Disp: 90 tablet, Rfl: 1   pantoprazole (PROTONIX) 40 MG tablet, Take 1 tablet (40 mg total) by mouth 2 (two) times daily., Disp: 60 tablet, Rfl: 3   sucralfate (CARAFATE) 1 g tablet, Take 1 tablet (1 g total) by mouth 4 (four) times daily -  with meals and at bedtime. for GERD/reflux/epigastric pain when eating or drinking, Disp: 40 tablet, Rfl: 2   tadalafil (CIALIS) 20 MG tablet, Take 0.5-1 tablets (10-20 mg  total) by mouth every other day as needed for erectile dysfunction., Disp: 10 tablet, Rfl: 11   Allergies  Allergen Reactions   Elemental Sulfur      Social History   Tobacco Use   Smoking status: Never   Smokeless tobacco: Current    Types: Chew  Vaping Use   Vaping Use: Never used  Substance Use Topics   Alcohol use: No   Drug use: No    Comment: used to smoke pot from [age] y old       Chart Review Today: I personally reviewed active problem list, medication list, allergies, family history, social history, health maintenance, notes from last encounter, lab results, imaging with the patient/caregiver today.   Review of Systems  Constitutional: Negative.   HENT: Negative.    Eyes: Negative.   Respiratory: Negative.    Cardiovascular: Negative.   Gastrointestinal: Negative.   Endocrine: Negative.   Genitourinary: Negative.   Musculoskeletal: Negative.   Skin: Negative.   Allergic/Immunologic: Negative.   Neurological: Negative.   Hematological: Negative.   Psychiatric/Behavioral: Negative.    All other systems reviewed and are negative.     Objective:   Vitals:   01/28/22 0904  BP: 126/70  Pulse: 73  Resp: 16  SpO2: 97%  Weight: 194 lb (88 kg)  Height: 5' 9" (1.753 m)    Body mass index is 28.65 kg/m.  Physical Exam Vitals and  nursing note reviewed.  Constitutional:      General: He is not in acute distress.    Appearance: Normal appearance. He is well-developed. He is not ill-appearing, toxic-appearing or diaphoretic.  HENT:     Head: Normocephalic and atraumatic.     Nose: Nose normal.  Eyes:     General:        Right eye: No discharge.        Left eye: No discharge.     Conjunctiva/sclera: Conjunctivae normal.  Neck:     Trachea: Trachea and phonation normal. No tracheal deviation.  Cardiovascular:     Rate and Rhythm: Normal rate and regular rhythm.     Pulses: Normal pulses.     Heart sounds: Normal heart sounds.  Pulmonary:      Effort: Pulmonary effort is normal. No respiratory distress.     Breath sounds: Normal breath sounds. No stridor.  Abdominal:     General: Bowel sounds are normal. There is no distension.     Palpations: Abdomen is soft.     Tenderness: There is no abdominal tenderness.  Musculoskeletal:        General: Normal range of motion.     Cervical back: Normal range of motion and neck supple.  Skin:    General: Skin is warm and dry.     Findings: No rash.  Neurological:     Mental Status: He is alert.     Motor: No abnormal muscle tone.     Coordination: Coordination normal.  Psychiatric:        Mood and Affect: Mood normal.        Behavior: Behavior normal.     Results for orders placed or performed in visit on 12/20/21  Aerobic culture  Result Value Ref Range   MICRO NUMBER: [id number]    SPECIMEN QUALITY: Adequate    SOURCE: WOUND (SITE NOT SPECIFIED)    STATUS: FINAL    AER ISOLATE 1: Group C Streptococcus (A)   Aerobic culture  Result Value Ref Range   MICRO NUMBER: CANCELED    SPECIMEN QUALITY: CANCELED    SOURCE: CANCELED    STATUS: CANCELED    AER RESULT: CANCELED   CBC with Differential/Platelet  Result Value Ref Range   WBC 6.7 3.8 - 10.8 Thousand/uL   RBC 4.72 4.20 - 5.80 Million/uL   Hemoglobin 14.5 13.2 - 17.1 g/dL   HCT 42.8 38.5 - 50.0 %   MCV 90.7 80.0 - 100.0 fL   MCH 30.7 27.0 - 33.0 pg   MCHC 33.9 32.0 - 36.0 g/dL   RDW 12.3 11.0 - 15.0 %   Platelets 294 140 - 400 Thousand/uL   MPV 9.4 7.5 - 12.5 fL   Neutro Abs 4,476 1,500 - 7,800 cells/uL   Lymphs Abs 1,655 850 - 3,900 cells/uL   Absolute Monocytes 503 200 - 950 cells/uL   Eosinophils Absolute 27 15 - 500 cells/uL   Basophils Absolute 40 0 - 200 cells/uL   Neutrophils Relative % 66.8 %   Total Lymphocyte 24.7 %   Monocytes Relative 7.5 %   Eosinophils Relative 0.4 %   Basophils Relative 0.6 %  COMPLETE METABOLIC PANEL WITH GFR  Result Value Ref Range   Glucose, Bld 79 65 - 99 mg/dL   BUN 18 7 -  25 mg/dL   Creat 0.95 0.70 - 1.30 mg/dL   eGFR 95 > OR = 60 mL/min/1.21m   BUN/Creatinine Ratio NOT APPLICABLE 6 - 22 (calc)  Sodium 142 135 - 146 mmol/L   Potassium 3.9 3.5 - 5.3 mmol/L   Chloride 108 98 - 110 mmol/L   CO2 26 20 - 32 mmol/L   Calcium 9.7 8.6 - 10.3 mg/dL   Total Protein 7.1 6.1 - 8.1 g/dL   Albumin 4.7 3.6 - 5.1 g/dL   Globulin 2.4 1.9 - 3.7 g/dL (calc)   AG Ratio 2.0 1.0 - 2.5 (calc)   Total Bilirubin 0.3 0.2 - 1.2 mg/dL   Alkaline phosphatase (APISO) 64 35 - 144 U/L   AST 16 10 - 35 U/L   ALT 13 9 - 46 U/L  TSH  Result Value Ref Range   TSH 1.22 0.40 - 4.50 mIU/L       Assessment & Plan:   1. Gastroesophageal reflux disease, unspecified whether esophagitis present Today complains of severe sx Increase PPI dose, pepcid prn, watch diet and lifestyle triggers Carafate may help? F/up GI - pantoprazole (PROTONIX) 40 MG tablet; Take 1 tablet (40 mg total) by mouth 2 (two) times daily.  Dispense: 60 tablet; Refill: 3 - sucralfate (CARAFATE) 1 g tablet; Take 1 tablet (1 g total) by mouth 4 (four) times daily -  with meals and at bedtime. for GERD/reflux/epigastric pain when eating or drinking  Dispense: 40 tablet; Refill: 2 - Ambulatory referral to Gastroenterology - famotidine (PEPCID) 20 MG tablet; Take 1 tablet (20 mg total) by mouth 2 (two) times daily as needed for heartburn or indigestion.  Dispense: 60 tablet; Refill: 1  2. Odynophagia Atypical hx of pain with swallowing - not exactly dysphagia - sometimes feels like neck is "stuck" limiting swallowing and movement - recent workup was negative for strictures, webs, esophageal diverticulum - only showed reflux Can try carafate May be some esophagitis? - sucralfate (CARAFATE) 1 g tablet; Take 1 tablet (1 g total) by mouth 4 (four) times daily -  with meals and at bedtime. for GERD/reflux/epigastric pain when eating or drinking  Dispense: 40 tablet; Refill: 2 - Ambulatory referral to Gastroenterology  3.  Hiatal hernia with GERD Noted on past imaging - pantoprazole (PROTONIX) 40 MG tablet; Take 1 tablet (40 mg total) by mouth 2 (two) times daily.  Dispense: 60 tablet; Refill: 3 - Ambulatory referral to Gastroenterology      Leisa Tapia, PA-C 01/28/22 9:14 AM  

## 2022-06-13 ENCOUNTER — Ambulatory Visit: Payer: Commercial Managed Care - PPO | Admitting: Gastroenterology

## 2022-07-22 ENCOUNTER — Ambulatory Visit (INDEPENDENT_AMBULATORY_CARE_PROVIDER_SITE_OTHER): Payer: Commercial Managed Care - PPO | Admitting: Family Medicine

## 2022-07-22 ENCOUNTER — Encounter: Payer: Self-pay | Admitting: Family Medicine

## 2022-07-22 VITALS — BP 122/80 | HR 94 | Temp 98.0°F | Resp 16 | Ht 69.0 in | Wt 220.4 lb

## 2022-07-22 DIAGNOSIS — E785 Hyperlipidemia, unspecified: Secondary | ICD-10-CM

## 2022-07-22 DIAGNOSIS — Z23 Encounter for immunization: Secondary | ICD-10-CM

## 2022-07-22 DIAGNOSIS — K449 Diaphragmatic hernia without obstruction or gangrene: Secondary | ICD-10-CM

## 2022-07-22 DIAGNOSIS — Z1211 Encounter for screening for malignant neoplasm of colon: Secondary | ICD-10-CM

## 2022-07-22 DIAGNOSIS — Z720 Tobacco use: Secondary | ICD-10-CM | POA: Diagnosis not present

## 2022-07-22 DIAGNOSIS — Z0000 Encounter for general adult medical examination without abnormal findings: Secondary | ICD-10-CM | POA: Diagnosis not present

## 2022-07-22 DIAGNOSIS — I10 Essential (primary) hypertension: Secondary | ICD-10-CM | POA: Diagnosis not present

## 2022-07-22 DIAGNOSIS — Z125 Encounter for screening for malignant neoplasm of prostate: Secondary | ICD-10-CM

## 2022-07-22 DIAGNOSIS — N529 Male erectile dysfunction, unspecified: Secondary | ICD-10-CM

## 2022-07-22 DIAGNOSIS — E669 Obesity, unspecified: Secondary | ICD-10-CM

## 2022-07-22 DIAGNOSIS — K219 Gastro-esophageal reflux disease without esophagitis: Secondary | ICD-10-CM

## 2022-07-22 DIAGNOSIS — Z6832 Body mass index (BMI) 32.0-32.9, adult: Secondary | ICD-10-CM

## 2022-07-22 MED ORDER — TADALAFIL 20 MG PO TABS: 10.0000 mg | ORAL_TABLET | ORAL | 11 refills | Status: DC | PRN

## 2022-07-22 MED ORDER — LOSARTAN POTASSIUM 25 MG PO TABS: 25.0000 mg | ORAL_TABLET | Freq: Every day | ORAL | 1 refills | Status: DC

## 2022-07-22 NOTE — Patient Instructions (Addendum)
Call [facility] for your colonoscopy and for consult about your GERD/reflux  Team Member Role and Specialty Contact Info Address  Jonathon Bellows, MD Consulting Physician (Gastroenterology) Phone: [phone]  [address] [place] [address]   Preventive Care 55-55 Years Old, Male Preventive care refers to lifestyle choices and visits with your health care provider that can promote health and wellness. Preventive care visits are also called wellness exams. What can I expect for my preventive care visit? Counseling During your preventive care visit, your health care provider may ask about your: Medical history, including: Past medical problems. Family medical history. Current health, including: Emotional well-being. Home life and relationship well-being. Sexual activity. Lifestyle, including: Alcohol, nicotine or tobacco, and drug use. Access to firearms. Diet, exercise, and sleep habits. Safety issues such as seatbelt and bike helmet use. Sunscreen use. Work and work Statistician. Physical exam Your health care provider will check your: Height and weight. These may be used to calculate your BMI (body mass index). BMI is a measurement that tells if you are at a healthy weight. Waist circumference. This measures the distance around your waistline. This measurement also tells if you are at a healthy weight and may help predict your risk of certain diseases, such as type 2 diabetes and high blood pressure. Heart rate and blood pressure. Body temperature. Skin for abnormal spots. What immunizations do I need?  Vaccines are usually given at various ages, according to a schedule. Your health care provider will recommend vaccines for you based on your age, medical history, and lifestyle or other factors, such as travel or where you work. What tests do I need? Screening Your health care provider may recommend screening tests for certain conditions. This may include: Lipid  and cholesterol levels. Diabetes screening. This is done by checking your blood sugar (glucose) after you have not eaten for a while (fasting). Hepatitis B test. Hepatitis C test. HIV (human immunodeficiency virus) test. STI (sexually transmitted infection) testing, if you are at risk. Lung cancer screening. Prostate cancer screening. Colorectal cancer screening. Talk with your health care provider about your test results, treatment options, and if necessary, the need for more tests. Follow these instructions at home: Eating and drinking  Eat a diet that includes fresh fruits and vegetables, whole grains, lean protein, and low-fat dairy products. Take vitamin and mineral supplements as recommended by your health care provider. Do not drink alcohol if your health care provider tells you not to drink. If you drink alcohol: Limit how much you have to 0-2 drinks a day. Know how much alcohol is in your drink. In the [place] one drink equals one 12 oz bottle of beer (355 mL), one 5 oz glass of wine (148 mL), or one 1 oz glass of hard liquor (44 mL). Lifestyle Brush your teeth every morning and night with fluoride toothpaste. Floss one time each day. Exercise for at least 30 minutes 5 or more days each week. Do not use any products that contain nicotine or tobacco. These products include cigarettes, chewing tobacco, and vaping devices, such as e-cigarettes. If you need help quitting, ask your health care provider. Do not use drugs. If you are sexually active, practice safe sex. Use a condom or other form of protection to prevent STIs. Take aspirin only as told by your health care provider. Make sure that you understand how much to take and what form to take. Work with your health care provider to find out whether it is  safe and beneficial for you to take aspirin daily. Find healthy ways to manage stress, such as: Meditation, yoga, or listening to music. Journaling. Talking to a trusted  person. Spending time with friends and family. Minimize exposure to UV radiation to reduce your risk of skin cancer. Safety Always wear your seat belt while driving or riding in a vehicle. Do not drive: If you have been drinking alcohol. Do not ride with someone who has been drinking. When you are tired or distracted. While texting. If you have been using any mind-altering substances or drugs. Wear a helmet and other protective equipment during sports activities. If you have firearms in your house, make sure you follow all gun safety procedures. What's next? Go to your health care provider once a year for an annual wellness visit. Ask your health care provider how often you should have your eyes and teeth checked. Stay up to date on all vaccines. This information is not intended to replace advice given to you by your health care provider. Make sure you discuss any questions you have with your health care provider. Document Revised: 02/28/2021 Document Reviewed: 02/28/2021 Elsevier Patient Education  [place].

## 2022-07-22 NOTE — Progress Notes (Signed)
Patient: Jason Duncan, Male    DOB: 18-Dec-1965, 56 y.o.   MRN: [id number] Delsa Grana, PA-C Visit Date: 07/22/2022  Today's Provider: Delsa Grana, PA-C   Chief Complaint  Patient presents with   Annual Exam   Subjective:   Annual physical exam:  Jason Duncan is a 56 y.o. male who presents today for health maintenance and annual & complete physical exam.   Exercise/Activity:  walks a lot at work Diet/nutrition:   drinking more water Sleep:  no concerns noted  [place]: No Food Insecurity (07/22/2022)  Housing: Low Risk  (07/22/2022)  Transportation Needs: No Transportation Needs (07/22/2022)  Utilities: Not At Risk (07/22/2022)  Alcohol Screen: Low Risk  (07/22/2022)  Depression (PHQ2-9): Low Risk  (07/22/2022)  Financial Resource Strain: Low Risk  (07/22/2022)  Physical Activity: Insufficiently Active (07/22/2022)  Social Connections: Unknown (07/22/2022)  Stress: Stress Concern Present (07/22/2022)  Tobacco Use: High Risk (07/22/2022)    Pt wished  do routine f/up on chronic conditions today in addition to CPE. Advised pt of separate visit billing/coding GERD - on protonix BID stopped carafate and pepcid, no dysphagia  Hypertension:  Currently managed on losartan Pt reports good med compliance and denies any SE.   Blood pressure today is well controlled. BP Readings from Last 3 Encounters:  07/22/22 122/80  01/28/22 126/70  12/20/21 138/78   Pt denies CP, SOB, exertional sx, LE edema, palpitation, Ha's, visual disturbances, lightheadedness, hypotension, syncope. Dietary efforts for BP?  Drinking water  Hyperlipidemia: Currently treated with diet and lifestyle Last Lipids: Lab Results  Component Value Date   CHOL 153 07/19/2021   HDL 38 (L) 07/19/2021   LDLCALC 99 07/19/2021   TRIG 74 07/19/2021   CHOLHDL 4.0 07/19/2021   - Denies: Chest pain, shortness of breath, myalgias, claudication   ED - request refills on  cialis  USPSTF grade A and B recommendations - reviewed and addressed today  Depression:  Phq 9 completed today by patient, was reviewed by me with patient in the room, score is  negative, pt feels good    07/22/2022    1:55 PM 01/28/2022    9:05 AM 12/20/2021    3:49 PM  Depression screen PHQ 2/9  Decreased Interest 0 0 0  Down, Depressed, Hopeless 0 0 0  PHQ - 2 Score 0 0 0  Altered sleeping 0 0 0  Tired, decreased energy 0 0 0  Change in appetite 0 0 0  Feeling bad or failure about yourself  0 0 0  Trouble concentrating 0 0 0  Moving slowly or fidgety/restless 0 0 0  Suicidal thoughts 0 0 0  PHQ-9 Score 0 0 0  Difficult doing work/chores Not difficult at all  Not difficult at all    Hep C Screening: done  STD testing and prevention (HIV/chl/gon/syphilis): done previously, married   Intimate partner violence:  denies  Advanced Care Planning:  A voluntary discussion about advance care planning including the explanation and discussion of advance directives.  Discussed health care proxy and Living will, and the patient was able to identify a health care proxy as wife Jason Duncan.  Patient does not know have a living will at present time. If patient does have living will, I have requested they bring this to the clinic to be scanned in to their chart.  Health Maintenance  Topic Date Due   COVID-19 Vaccine (5 - Moderna series) 08/07/2022 (Originally 08/02/2021)   COLONOSCOPY (Pts  45-76yr Insurance coverage will need to be confirmed)  08/02/2022   TETANUS/TDAP  09/16/2024   INFLUENZA VACCINE  Completed   Hepatitis C Screening  Completed   HIV Screening  Completed   HPV VACCINES  Aged Out   Zoster Vaccines- Shingrix  Discontinued    Skin cancer: denies   last skin survey was.  Pt reports no hx of skin cancer, suspicious lesions/biopsies in the past.  Colorectal cancer:  colonoscopy is due 1//17/2023 3 year f/up    Prostate cancer:  Prostate cancer screening with PSA: Discussed  risks and benefits of PSA testing and provided handout. Pt will have PSA drawn today. Lab Results  Component Value Date   PSA 1.05 07/19/2021   PSA 1.0 06/28/2019   PSA 1.2 07/09/2018    Urinary Symptoms:   IPSS     Row Name 07/22/22 1403         International Prostate Symptom Score   How often have you had the sensation of not emptying your bladder? About half the time     How often have you had to urinate less than every two hours? More than half the time     How often have you found you stopped and started again several times when you urinated? About half the time     How often have you found it difficult to postpone urination? More than half the time     How often have you had a weak urinary stream? About half the time     How often have you had to strain to start urination? Less than half the time     How many times did you typically get up at night to urinate? 1 Time     Total IPSS Score 20       Quality of Life due to urinary symptoms   If you were to spend the rest of your life with your urinary condition just the way it is now how would you feel about that? Mostly Satisfied              Lung cancer:   Low Dose CT Chest recommended if Age 55-80years, 20 pack-year currently smoking OR have quit w/in 15years. Patient does not qualify.   Social History   Tobacco Use   Smoking status: Never   Smokeless tobacco: Current    Types: Chew  Substance Use Topics   Alcohol use: No     Al[place] screening: [place] [facility] Score 0       AAA: n/a The USPSTF recommends one-time screening with ultrasonography in men ages 629to 779years who have ever smoked  EIZT:IWPYindicated today  Blood pressure/Hypertension: BP Readings from Last 3 Encounters:  07/22/22 122/80  01/28/22 126/70  12/20/21 138/78   Weight/Obesity: Wt Readings from Last 3 Encounters:  07/22/22 220 lb 6.4 oz (100 kg)  01/28/22  194 lb (88 kg)  12/20/21 197 lb (89.4 kg)   BMI Readings from Last 3 Encounters:  07/22/22 32.55 kg/m  01/28/22 28.65 kg/m  12/20/21 29.09 kg/m    Lipids:  Lab Results  Component Value Date   CHOL 153 07/19/2021   CHOL 148 07/18/2020   CHOL 138 06/28/2019   Lab Results  Component Value Date   HDL 38 (L) 07/19/2021   HDL 35 (L) 07/18/2020   HDL 36 (L) 06/28/2019   Lab Results  Component Value Date   LDLCALC  99 07/19/2021   LDLCALC 94 07/18/2020   LDLCALC 82 06/28/2019   Lab Results  Component Value Date   TRIG 74 07/19/2021   TRIG 91 07/18/2020   TRIG 108 06/28/2019   Lab Results  Component Value Date   CHOLHDL 4.0 07/19/2021   CHOLHDL 4.2 07/18/2020   CHOLHDL 3.8 06/28/2019   No results found for: "LDLDIRECT" Based on the results of lipid panel his/her cardiovascular risk factor ( using [facility] )  in the next 10 years is : The 10-year ASCVD risk score (Arnett DK, et al., 2019) is: 5.1%   Values used to calculate the score:     Age: 47 years     Sex: Male     Is Non-Hispanic African American: No     Diabetic: No     Tobacco smoker: No     Systolic Blood Pressure: 174 mmHg     Is BP treated: Yes     HDL Cholesterol: 38 mg/dL     Total Cholesterol: 153 mg/dL Glucose:  Glucose  Date Value Ref Range Status  04/30/2021 86 65 - 99 mg/dL Final  07/27/2012 100 (H) 65 - 99 mg/dL Final   Glucose, Bld  Date Value Ref Range Status  12/20/2021 79 65 - 99 mg/dL Final    Comment:    .            Fasting reference interval .   07/19/2021 80 65 - 99 mg/dL Final    Comment:    .            Fasting reference interval .   04/24/2021 94 70 - 99 mg/dL Final    Comment:    Glucose reference range applies only to samples taken after fasting for at least 8 hours.    Social History       Social History   Socioeconomic History   Marital status: Married    Spouse name: Jason Duncan    Number of children: 3   Years of education: Not on file   Highest  education level: Some college, no degree  Occupational History   Not on file  Tobacco Use   Smoking status: Never   Smokeless tobacco: Current    Types: Chew  Vaping Use   Vaping Use: Never used  Substance and Sexual Activity   Alcohol use: No   Drug use: No    Comment: used to smoke pot from [age] y old    Sexual activity: Yes  Other Topics Concern   Not on file  Social History Narrative   Not on file   Social Determinants of Health   Financial Resource Strain: Low Risk  (07/22/2022)   Overall Financial Resource Strain (CARDIA)    Difficulty of Paying Living Expenses: Not hard at all  Food Insecurity: No Food Insecurity (07/22/2022)   Hunger Vital Sign    Worried About Running Out of Food in the Last Year: Never true    Ran Out of Food in the Last Year: Never true  Transportation Needs: No Transportation Needs (07/22/2022)   PRAPARE - Hydrologist (Medical): No    Lack of Transportation (Non-Medical): No  Physical Activity: Insufficiently Active (07/22/2022)   Exercise Vital Sign    Days of Exercise per Week: 2 days    Minutes of Exercise per Session: 30 min  Stress: Stress Concern Present (07/22/2022)   [place]    Feeling  of Stress : To some extent  Social Connections: Unknown (07/22/2022)   Social Connection and Isolation Panel [NHANES]    Frequency of Communication with Friends and Family: More than three times a week    Frequency of Social Gatherings with Friends and Family: Twice a week    Attends Religious Services: Not on Advertising copywriter or Organizations: No    Attends Archivist Meetings: Never    Marital Status: Married    Family History        Family History  Problem Relation Age of Onset   Hypertension Mother    Pneumonia Maternal Grandmother    Cancer Maternal Grandfather        lung   Heart disease Brother        49 years old, died  from a heart attack    Patient Active Problem List   Diagnosis Date Noted   Primary hypertension 07/22/2022   Chest pain 04/24/2021   Insomnia 06/28/2019   Tobacco chew use 06/28/2019   OSA (obstructive sleep apnea) 06/28/2019   Chronic pansinusitis 06/28/2019   Hiatal hernia with GERD 03/02/2019   Erectile dysfunction 06/24/2017   Vitamin D deficiency 06/24/2017    Past Surgical History:  Procedure Laterality Date   COLONOSCOPY WITH PROPOFOL N/A 08/03/2019   Procedure: COLONOSCOPY WITH PROPOFOL;  Surgeon: Jonathon Bellows, MD;  Location: [facility] ENDOSCOPY;  Service: Gastroenterology;  Laterality: N/A;   EXTERNAL EAR SURGERY Bilateral 1973   LEG SURGERY Right 1988   rods placed due to broken leg   SHOULDER SURGERY Right 2003     Current Outpatient Medications:    pantoprazole (PROTONIX) 40 MG tablet, Take 1 tablet (40 mg total) by mouth 2 (two) times daily., Disp: 60 tablet, Rfl: 3   losartan (COZAAR) 25 MG tablet, Take 1 tablet (25 mg total) by mouth daily., Disp: 90 tablet, Rfl: 1   tadalafil (CIALIS) 20 MG tablet, Take 0.5-1 tablets (10-20 mg total) by mouth every other day as needed for erectile dysfunction., Disp: 10 tablet, Rfl: 11  Allergies  Allergen Reactions   Elemental Sulfur     Patient Care Team: Delsa Grana, PA-C as PCP - General (Family Medicine) Kate Sable, MD as PCP - Cardiology (Cardiology) Jonathon Bellows, MD as Consulting Physician (Gastroenterology)   Chart Review: I personally reviewed active problem list, medication list, allergies, family history, social history, health maintenance, notes from last encounter, lab results, imaging with the patient/caregiver today.   Review of Systems  Constitutional: Negative.   HENT: Negative.    Eyes: Negative.   Respiratory: Negative.    Cardiovascular: Negative.   Gastrointestinal: Negative.   Endocrine: Negative.   Genitourinary: Negative.   Musculoskeletal: Negative.   Skin: Negative.    Allergic/Immunologic: Negative.   Neurological: Negative.   Hematological: Negative.   Psychiatric/Behavioral: Negative.    All other systems reviewed and are negative.         Objective:   Vitals:  Vitals:   07/22/22 1358  BP: 122/80  Pulse: 94  Resp: 16  Temp: 98 F (36.7 C)  TempSrc: Oral  SpO2: 98%  Weight: 220 lb 6.4 oz (100 kg)  Height: '5\' 9"'$  (1.753 m)    Body mass index is 32.55 kg/m.  Physical Exam Vitals and nursing note reviewed.  Constitutional:      General: He is not in acute distress.    Appearance: Normal appearance. He is well-developed. He is obese. He is not ill-appearing, toxic-appearing  or diaphoretic.  HENT:     Head: Normocephalic and atraumatic.     Jaw: No trismus.     Right Ear: External ear normal.     Left Ear: External ear normal.     Nose: Nose normal. No mucosal edema.     Right Sinus: No maxillary sinus tenderness or frontal sinus tenderness.     Left Sinus: No maxillary sinus tenderness or frontal sinus tenderness.     Mouth/Throat:     Pharynx: Uvula midline. No uvula swelling.  Eyes:     General: Lids are normal. No scleral icterus.       Right eye: No discharge.        Left eye: No discharge.     Conjunctiva/sclera: Conjunctivae normal.  Neck:     Trachea: Trachea and phonation normal. No tracheal deviation.  Cardiovascular:     Rate and Rhythm: Normal rate and regular rhythm.     Pulses: Normal pulses.          Radial pulses are 2+ on the right side and 2+ on the left side.       Posterior tibial pulses are 2+ on the right side and 2+ on the left side.     Heart sounds: Normal heart sounds. No murmur heard.    No friction rub. No gallop.  Pulmonary:     Effort: Pulmonary effort is normal. No respiratory distress.     Breath sounds: Normal breath sounds. No stridor. No wheezing, rhonchi or rales.  Abdominal:     General: Bowel sounds are normal. There is no distension.     Palpations: Abdomen is soft.  Musculoskeletal:      Cervical back: Normal range of motion.     Right lower leg: No edema.     Left lower leg: No edema.  Skin:    General: Skin is warm and dry.     Capillary Refill: Capillary refill takes less than 2 seconds.     Findings: No rash.  Neurological:     Mental Status: He is alert. Mental status is at baseline.     Gait: Gait normal.  Psychiatric:        Mood and Affect: Mood normal.        Speech: Speech normal.        Behavior: Behavior normal.      No results found for this or any previous visit (from the past 2160 hour(s)).  Fall Risk:    07/22/2022    1:55 PM 01/28/2022    9:04 AM 12/20/2021    3:49 PM 07/19/2021    8:12 AM 04/24/2021   10:00 AM  Fall Risk   Falls in the past year? 0 0 0 0 0  Number falls in past yr: 0 0 0 0 0  Injury with Fall? 0 0 0 0 0  Risk for fall due to : No Fall Risks No Fall Risks No Fall Risks No Fall Risks   Follow up Falls evaluation completed;Education provided;Falls prevention discussed Falls prevention discussed Falls prevention discussed Falls prevention discussed Falls evaluation completed    Functional Status Survey: Is the patient deaf or have difficulty hearing?: No Does the patient have difficulty seeing, even when wearing glasses/contacts?: Yes Does the patient have difficulty concentrating, remembering, or making decisions?: No Does the patient have difficulty walking or climbing stairs?: No Does the patient have difficulty dressing or bathing?: No Does the patient have difficulty doing errands alone such as visiting a  doctor's office or shopping?: No   Assessment & Plan:    CPE completed today  Prostate cancer screening and PSA options (with potential risks and benefits of testing vs not testing) were discussed along with recent recs/guidelines, shared decision making and handout/information given to pt today  USPSTF grade A and B recommendations reviewed with patient; age-appropriate recommendations, preventive care, screening  tests, etc discussed and encouraged; healthy living encouraged; see AVS for patient education given to patient  Discussed importance of 150 minutes of physical activity weekly, AHA exercise recommendations given to pt in AVS/handout  Discussed importance of healthy diet:  eating lean meats and proteins, avoiding trans fats and saturated fats, avoid simple sugars and excessive carbs in diet, eat 6 servings of fruit/vegetables daily and drink plenty of water and avoid sweet beverages.  DASH diet reviewed if pt has HTN  Recommended pt to do annual eye exam and routine dental exams/cleanings  Advance Care planning information and packet discussed and offered today, encouraged pt to discuss with family members/spouse/partner/friends and complete Advanced directive packet and bring copy to office   Reviewed Health Maintenance: Health Maintenance  Topic Date Due   COVID-19 Vaccine (5 - Moderna series) 08/07/2022 (Originally 08/02/2021)   COLONOSCOPY (Pts 45-25yr Insurance coverage will need to be confirmed)  08/02/2022   TETANUS/TDAP  09/16/2024   INFLUENZA VACCINE  Completed   Hepatitis C Screening  Completed   HIV Screening  Completed   HPV VACCINES  Aged Out   Zoster Vaccines- Shingrix  Discontinued    Immunizations: Immunization History  Administered Date(s) Administered   Influenza-Unspecified 06/18/2019, 07/06/2020, 06/28/2022   Moderna Sars-Covid-2 Vaccination 09/27/2019, 10/25/2019, 07/28/2020, 06/07/2021   Vaccines:  HPV: up to at age 55, ask insurance if age between 233-45 Shingrix: 558-64yo and ask insurance if covered when patient above 63yo Pneumonia:  educated and discussed with patient. Flu: done educated and discussed with patient. COVID:       ICD-10-CM   1. Annual physical exam  ZR15.40COMPLETE METABOLIC PANEL WITH GFR    Lipid panel    CBC with Differential/Platelet    PSA    2. Need for influenza vaccination  Z23    reports he got it    3. Hyperlipidemia,  unspecified hyperlipidemia type  EG86.7COMPLETE METABOLIC PANEL WITH GFR    Lipid panel    4. Screening for malignant neoplasm of prostate  Z12.5     5. Tobacco chew use  Z72.0     6. Primary hypertension  I10 losartan (COZAAR) 25 MG tablet   BP at goal on losartan 25 mg, pt reports no SE or concerns, due for labs, needs 6 month f/up, continue meds and DASH    7. Erectile dysfunction, unspecified erectile dysfunction type  N52.9 tadalafil (CIALIS) 20 MG tablet   cialis refill requested, endorses effectiveness w/o concerning SE    8. Gastroesophageal reflux disease, unspecified whether esophagitis present  K21.9 Ambulatory referral to Gastroenterology   needing protonix 40 mg BID to control sx, recommended that pt consult with GI    9. Screening for malignant neoplasm of colon  Z12.11 Ambulatory referral to Gastroenterology    10. Class 1 obesity with serious comorbidity and body mass index (BMI) of 32.0 to 32.9 in adult, unspecified obesity type  E66.9    Z68.32    associated comorbidities of HTN, HLD, OSA    11. Hiatal hernia with GERD  K44.9    K21.9    see  above          Delsa Grana, PA-C 07/22/22 2:48 PM  [facility]

## 2022-07-23 ENCOUNTER — Other Ambulatory Visit: Payer: Self-pay | Admitting: Family Medicine

## 2022-07-23 DIAGNOSIS — E785 Hyperlipidemia, unspecified: Secondary | ICD-10-CM

## 2022-07-23 LAB — CBC WITH DIFFERENTIAL/PLATELET
Absolute Monocytes: 482 cells/uL (ref 200–950)
Basophils Absolute: 43 cells/uL (ref 0–200)
Basophils Relative: 0.7 %
Eosinophils Absolute: 128 cells/uL (ref 15–500)
Eosinophils Relative: 2.1 %
HCT: 43.3 % (ref 38.5–50.0)
Hemoglobin: 15.2 g/dL (ref 13.2–17.1)
Lymphs Abs: 1671 cells/uL (ref 850–3900)
MCH: 31.2 pg (ref 27.0–33.0)
MCHC: 35.1 g/dL (ref 32.0–36.0)
MCV: 88.9 fL (ref 80.0–100.0)
MPV: 9.2 fL (ref 7.5–12.5)
Monocytes Relative: 7.9 %
Neutro Abs: 3776 cells/uL (ref 1500–7800)
Neutrophils Relative %: 61.9 %
Platelets: 290 10*3/uL (ref 140–400)
RBC: 4.87 10*6/uL (ref 4.20–5.80)
RDW: 12.3 % (ref 11.0–15.0)
Total Lymphocyte: 27.4 %
WBC: 6.1 10*3/uL (ref 3.8–10.8)

## 2022-07-23 LAB — COMPLETE METABOLIC PANEL WITH GFR
AG Ratio: 2.3 (calc) (ref 1.0–2.5)
ALT: 26 U/L (ref 9–46)
AST: 20 U/L (ref 10–35)
Albumin: 5 g/dL (ref 3.6–5.1)
Alkaline phosphatase (APISO): 58 U/L (ref 35–144)
BUN: 13 mg/dL (ref 7–25)
CO2: 26 mmol/L (ref 20–32)
Calcium: 9.9 mg/dL (ref 8.6–10.3)
Chloride: 106 mmol/L (ref 98–110)
Creat: 1.15 mg/dL (ref 0.70–1.30)
Globulin: 2.2 g/dL (calc) (ref 1.9–3.7)
Glucose, Bld: 69 mg/dL (ref 65–99)
Potassium: 3.9 mmol/L (ref 3.5–5.3)
Sodium: 141 mmol/L (ref 135–146)
Total Bilirubin: 0.4 mg/dL (ref 0.2–1.2)
Total Protein: 7.2 g/dL (ref 6.1–8.1)
eGFR: 76 mL/min/{1.73_m2} (ref >=60)

## 2022-07-23 LAB — PSA: PSA: 1.5 ng/mL (ref <=4.00)

## 2022-07-23 LAB — LIPID PANEL
Cholesterol: 168 mg/dL (ref <200)
HDL: 38 mg/dL — ABNORMAL LOW (ref >=40)
LDL Cholesterol (Calc): 100 mg/dL (calc) — ABNORMAL HIGH
Non-HDL Cholesterol (Calc): 130 mg/dL (calc) — ABNORMAL HIGH (ref <130)
Total CHOL/HDL Ratio: 4.4 (calc) (ref <5.0)
Triglycerides: 179 mg/dL — ABNORMAL HIGH (ref <150)

## 2022-07-23 MED ORDER — ATORVASTATIN CALCIUM 10 MG PO TABS: 10.0000 mg | ORAL_TABLET | Freq: Every day | ORAL | 3 refills | Status: DC

## 2022-12-05 ENCOUNTER — Ambulatory Visit: Payer: Self-pay | Admitting: Gastroenterology

## 2023-01-20 ENCOUNTER — Encounter: Payer: Self-pay | Admitting: Physician Assistant

## 2023-01-20 ENCOUNTER — Other Ambulatory Visit: Payer: Self-pay

## 2023-01-20 ENCOUNTER — Ambulatory Visit: Payer: BC Managed Care – PPO | Admitting: Family Medicine

## 2023-01-20 ENCOUNTER — Telehealth: Payer: Self-pay

## 2023-01-20 ENCOUNTER — Ambulatory Visit (INDEPENDENT_AMBULATORY_CARE_PROVIDER_SITE_OTHER): Payer: BC Managed Care – PPO | Admitting: Physician Assistant

## 2023-01-20 VITALS — BP 126/78 | HR 85 | Temp 98.1°F | Resp 16 | Ht 69.0 in | Wt 194.6 lb

## 2023-01-20 DIAGNOSIS — Z1211 Encounter for screening for malignant neoplasm of colon: Secondary | ICD-10-CM | POA: Diagnosis not present

## 2023-01-20 DIAGNOSIS — E785 Hyperlipidemia, unspecified: Secondary | ICD-10-CM

## 2023-01-20 DIAGNOSIS — B029 Zoster without complications: Secondary | ICD-10-CM

## 2023-01-20 DIAGNOSIS — I10 Essential (primary) hypertension: Secondary | ICD-10-CM

## 2023-01-20 DIAGNOSIS — E669 Obesity, unspecified: Secondary | ICD-10-CM

## 2023-01-20 DIAGNOSIS — Z86010 Personal history of colonic polyps: Secondary | ICD-10-CM

## 2023-01-20 DIAGNOSIS — K219 Gastro-esophageal reflux disease without esophagitis: Secondary | ICD-10-CM

## 2023-01-20 MED ORDER — GABAPENTIN 100 MG PO CAPS
100.0000 mg | ORAL_CAPSULE | Freq: Three times a day (TID) | ORAL | 0 refills | Status: DC | PRN
Start: 2023-01-20 — End: 2023-07-29

## 2023-01-20 MED ORDER — ACYCLOVIR 800 MG PO TABS
800.0000 mg | ORAL_TABLET | Freq: Every day | ORAL | 0 refills | Status: AC
Start: 2023-01-20 — End: 2023-01-27

## 2023-01-20 MED ORDER — NA SULFATE-K SULFATE-MG SULF 17.5-3.13-1.6 GM/177ML PO SOLN: 1.0000 | Freq: Once | ORAL | 0 refills | Status: AC

## 2023-01-20 NOTE — Progress Notes (Deleted)
Name: Jason Duncan   MRN: [id number]    DOB: 1965-09-23   Date:01/20/2023       Progress Note  No chief complaint on file.    Subjective:   Jason Duncan is a 57 y.o. male, presents to clinic for routine f/up on chronic conditions  Last OV with for CPE about 6 months ago  HLD started on lipitor 10 mg  Lab Results  Component Value Date   CHOL 168 07/22/2022   HDL 38 (L) 07/22/2022   LDLCALC 100 (H) 07/22/2022   TRIG 179 (H) 07/22/2022   CHOLHDL 4.4 07/22/2022  The 10-year ASCVD risk score (Arnett DK, et al., 2019) is: 6.3%   Values used to calculate the score:     Age: 7 years     Sex: Male     Is Non-Hispanic African American: No     Diabetic: No     Tobacco smoker: No     Systolic Blood Pressure: 122 mmHg     Is BP treated: Yes     HDL Cholesterol: 38 mg/dL     Total Cholesterol: 168 mg/dL  GERD - long term PPI use - BID - overdue for CRC screening f/up x 3 years with GI, last OV referral placed: Referral Notes Number of Notes: 1 . Type Date User Summary Attachment  Provider Comments 07/22/2022  2:23 PM [name], PA-C Provider Comments -  Note: Needs consult for GERD on BID PPI unable to wean off, also due for 3 year f/up colonoscopy that was due 08/02/2022 so needs OV consult and set up his colonoscopy and possibly endoscopy  Hypertension:  Currently managed on losartan 25 mg Pt reports *** med compliance and denies any SE.   Blood pressure today is *** controlled. BP Readings from Last 3 Encounters:  07/22/22 122/80  01/28/22 126/70  12/20/21 138/78   Pt denies CP, SOB, exertional sx, LE edema, palpitation, Ha's, visual disturbances, lightheadedness, hypotension, syncope. Dietary efforts for BP?  ***   ED - on cialis   OSA unable to tolerate CPAP Chronic sinusitis ***   Current Outpatient Medications:    atorvastatin (LIPITOR) 10 MG tablet, Take 1 tablet (10 mg total) by mouth at bedtime., Disp: 90 tablet, Rfl: 3   losartan (COZAAR) 25  MG tablet, Take 1 tablet (25 mg total) by mouth daily., Disp: 90 tablet, Rfl: 1   pantoprazole (PROTONIX) 40 MG tablet, Take 1 tablet (40 mg total) by mouth 2 (two) times daily., Disp: 60 tablet, Rfl: 3   tadalafil (CIALIS) 20 MG tablet, Take 0.5-1 tablets (10-20 mg total) by mouth every other day as needed for erectile dysfunction., Disp: 10 tablet, Rfl: 11  Patient Active Problem List   Diagnosis Date Noted   Primary hypertension 07/22/2022   Chest pain 04/24/2021   Insomnia 06/28/2019   Tobacco chew use 06/28/2019   OSA (obstructive sleep apnea) 06/28/2019   Chronic pansinusitis 06/28/2019   Hiatal hernia with GERD 03/02/2019   Erectile dysfunction 06/24/2017   Vitamin D deficiency 06/24/2017    Past Surgical History:  Procedure Laterality Date   COLONOSCOPY WITH PROPOFOL N/A 08/03/2019   Procedure: COLONOSCOPY WITH PROPOFOL;  Surgeon: [name], MD;  Location[facility] ENDOSCOPY;  Service: Gastroenterology;  Laterality: N/A;   EXTERNAL EAR SURGERY Bilateral 1973   LEG SURGERY Right 1988   rods placed due to broken leg   SHOULDER SURGERY Right 2003    Family History  Problem Relation Age of Onset  Hypertension Mother    Pneumonia Maternal Grandmother    Cancer Maternal Grandfather        lung   Heart disease Brother        8 years old, died from a heart attack    Social History   Tobacco Use   Smoking status: Never   Smokeless tobacco: Current    Types: Chew  Vaping Use   Vaping Use: Never used  Substance Use Topics   Alcohol use: No   Drug use: No    Comment: used to smoke pot from [age] y old      Allergies  Allergen Reactions   Elemental Sulfur     Health Maintenance  Topic Date Due   DTaP/Tdap/Td (1 - Tdap) Never done   COVID-19 Vaccine (5 - 2023-24 season) 05/17/2022   COLONOSCOPY (Pts 45-55yrs Insurance coverage will need to be confirmed)  08/02/2022   INFLUENZA VACCINE  04/17/2023   Hepatitis C Screening  Completed   HIV Screening  Completed    HPV VACCINES  Aged Out   Zoster Vaccines- Shingrix  Discontinued    Chart Review Today: ***  Review of Systems   Objective:   There were no vitals filed for this visit.  There is no height or weight on file to calculate BMI.  Physical Exam      Assessment & Plan:   Problem List Items Addressed This Visit       Cardiovascular and Mediastinum   Primary hypertension   Other Visit Diagnoses     Hyperlipidemia, unspecified hyperlipidemia type    -  Primary   Class 1 obesity with serious comorbidity and body mass index (BMI) of 32.0 to 32.9 in adult, unspecified obesity type       Gastroesophageal reflux disease, unspecified whether esophagitis present            No follow-ups on file.   [name], PA-C 01/20/23 1:15 PM

## 2023-01-20 NOTE — Progress Notes (Deleted)
Established Patient Office Visit  Name: Jason Duncan   MRN: [id number]    DOB: 1966/07/25   Date:01/20/2023  Today's Provider: Jacquelin Hawking, MHS, PA-C Introduced myself to the patient as a PA-C and provided education on APPs in clinical practice.         Subjective  Chief Complaint  Chief Complaint  Patient presents with   Follow-up   Hypertension   Hyperlipidemia    HPI  HYPERTENSION / HYPERLIPIDEMIA Satisfied with current treatment? {Blank single:19197::"yes","no"} Duration of hypertension: chronic BP monitoring frequency: {Blank single:19197::"not checking","rarely","daily","weekly","monthly","a few times a day","a few times a week","a few times a month"} BP range:  BP medication side effects: no Past BP meds: losartan (cozaar) Duration of hyperlipidemia: chronic Cholesterol medication side effects: {Blank single:19197::"yes","no"} Cholesterol supplements: none Past cholesterol medications: {Blank multiple:19196::"none","atorvastain (lipitor)","lovastatin (mevacor)","pravastatin (pravachol)","rosuvastatin (crestor)","simvastatin (zocor)","vytorin","fenofibrate (tricor)","gemfibrozil","ezetimide (zetia)","niaspan","lovaza"} Medication compliance: {Blank single:19197::"excellent compliance","good compliance","fair compliance","poor compliance"} Aspirin: no Recent stressors: {Blank single:19197::"yes","no"} Recurrent headaches: {Blank single:19197::"yes","no"} Visual changes: {Blank single:19197::"yes","no"} Palpitations: {Blank single:19197::"yes","no"} Dyspnea: {Blank single:19197::"yes","no"} Chest pain: {Blank single:19197::"yes","no"} Lower extremity edema: {Blank single:19197::"yes","no"} Dizzy/lightheaded: {Blank single:19197::"yes","no"}   Patient Active Problem List   Diagnosis Date Noted   Primary hypertension 07/22/2022   Chest pain 04/24/2021   Insomnia 06/28/2019   Tobacco chew use 06/28/2019   OSA (obstructive sleep apnea) 06/28/2019    Chronic pansinusitis 06/28/2019   Hiatal hernia with GERD 03/02/2019   Erectile dysfunction 06/24/2017   Vitamin D deficiency 06/24/2017    Past Surgical History:  Procedure Laterality Date   COLONOSCOPY WITH PROPOFOL N/A 08/03/2019   Procedure: COLONOSCOPY WITH PROPOFOL;  Surgeon: [name], MD;  Location: [facility] ENDOSCOPY;  Service: Gastroenterology;  Laterality: N/A;   EXTERNAL EAR SURGERY Bilateral 1973   LEG SURGERY Right 1988   rods placed due to broken leg   SHOULDER SURGERY Right 2003    Family History  Problem Relation Age of Onset   Hypertension Mother    Pneumonia Maternal Grandmother    Cancer Maternal Grandfather        lung   Heart disease Brother        69 years old, died from a heart attack    Social History   Tobacco Use   Smoking status: Never   Smokeless tobacco: Current    Types: Chew  Substance Use Topics   Alcohol use: No     Current Outpatient Medications:    atorvastatin (LIPITOR) 10 MG tablet, Take 1 tablet (10 mg total) by mouth at bedtime. (Patient not taking: Reported on 01/20/2023), Disp: 90 tablet, Rfl: 3   losartan (COZAAR) 25 MG tablet, Take 1 tablet (25 mg total) by mouth daily., Disp: 90 tablet, Rfl: 1   tadalafil (CIALIS) 20 MG tablet, Take 0.5-1 tablets (10-20 mg total) by mouth every other day as needed for erectile dysfunction., Disp: 10 tablet, Rfl: 11   pantoprazole (PROTONIX) 40 MG tablet, Take 1 tablet (40 mg total) by mouth 2 (two) times daily. (Patient not taking: Reported on 01/20/2023), Disp: 60 tablet, Rfl: 3  Allergies  Allergen Reactions   Elemental Sulfur     I personally reviewed active problem list, medication list, allergies, health maintenance, notes from last encounter, lab results with the patient/caregiver today.   ROS    Objective  There were no vitals filed for this visit.  There is no height or weight on file to calculate BMI.  Physical Exam   No results found for this or any previous visit (  from  the past 2160 hour(s)).   PHQ2/9:    07/22/2022    1:55 PM 01/28/2022    9:05 AM 12/20/2021    3:49 PM 07/19/2021    8:12 AM 04/24/2021   10:00 AM  Depression screen PHQ 2/9  Decreased Interest 0 0 0 0 2  Down, Depressed, Hopeless 0 0 0 0   PHQ - 2 Score 0 0 0 0 2  Altered sleeping 0 0 0 0 0  Tired, decreased energy 0 0 0 0 0  Change in appetite 0 0 0 0 0  Feeling bad or failure about yourself  0 0 0 0 0  Trouble concentrating 0 0 0 0 0  Moving slowly or fidgety/restless 0 0 0 0 0  Suicidal thoughts 0 0 0 0 0  PHQ-9 Score 0 0 0 0 2  Difficult doing work/chores Not difficult at all  Not difficult at all Not difficult at all Not difficult at all      Fall Risk:    01/20/2023    2:08 PM 07/22/2022    1:55 PM 01/28/2022    9:04 AM 12/20/2021    3:49 PM 07/19/2021    8:12 AM  Fall Risk   Falls in the past year? 0 0 0 0 0  Number falls in past yr: 0 0 0 0 0  Injury with Fall? 0 0 0 0 0  Risk for fall due to : No Fall Risks No Fall Risks No Fall Risks No Fall Risks No Fall Risks  Follow up Falls prevention discussed;Education provided;Falls evaluation completed Falls evaluation completed;Education provided;Falls prevention discussed Falls prevention discussed Falls prevention discussed Falls prevention discussed      Functional Status Survey: Is the patient deaf or have difficulty hearing?: No Does the patient have difficulty seeing, even when wearing glasses/contacts?: No Does the patient have difficulty concentrating, remembering, or making decisions?: Yes Does the patient have difficulty walking or climbing stairs?: No Does the patient have difficulty dressing or bathing?: No Does the patient have difficulty doing errands alone such as visiting a doctor's office or shopping?: No    Assessment & Plan  Problem List Items Addressed This Visit   None Visit Diagnoses     Screening for colon cancer    -  Primary        No follow-ups on file.   I, Saleema Weppler E Samayah Novinger, PA-C, have  reviewed all documentation for this visit. The documentation on 01/20/23 for the exam, diagnosis, procedures, and orders are all accurate and complete.   Jacquelin Hawking, MHS, PA-C [facility]

## 2023-01-20 NOTE — Telephone Encounter (Signed)
Gastroenterology Pre-Procedure Review  Request Date: 03/28/23 Requesting Physician: Dr. Tobi Bastos  PATIENT REVIEW QUESTIONS: The patient responded to the following health history questions as indicated:    1. Are you having any GI issues? no 2. Do you have a personal history of Polyps? yes (08/03/2019 colonoscopy performed by Dr. Tobi Bastos ) 3. Do you have a family history of Colon Cancer or Polyps? no 4. Diabetes Mellitus? no 5. Joint replacements in the past 12 months?no 6. Major health problems in the past 3 months?no 7. Any artificial heart valves, MVP, or defibrillator?no    MEDICATIONS & ALLERGIES:    Patient reports the following regarding taking any anticoagulation/antiplatelet therapy:   Plavix, Coumadin, Eliquis, Xarelto, Lovenox, Pradaxa, Brilinta, or Effient? no Aspirin? no  Patient confirms/reports the following medications:  Current Outpatient Medications  Medication Sig Dispense Refill   atorvastatin (LIPITOR) 10 MG tablet Take 1 tablet (10 mg total) by mouth at bedtime. (Patient not taking: Reported on 01/20/2023) 90 tablet 3   acyclovir (ZOVIRAX) 800 MG tablet Take 1 tablet (800 mg total) by mouth 5 (five) times daily for 7 days. 35 tablet 0   gabapentin (NEURONTIN) 100 MG capsule Take 1 capsule (100 mg total) by mouth 3 (three) times daily as needed. 30 capsule 0   losartan (COZAAR) 25 MG tablet Take 1 tablet (25 mg total) by mouth daily. 90 tablet 1   pantoprazole (PROTONIX) 40 MG tablet Take 1 tablet (40 mg total) by mouth 2 (two) times daily. (Patient not taking: Reported on 01/20/2023) 60 tablet 3   tadalafil (CIALIS) 20 MG tablet Take 0.5-1 tablets (10-20 mg total) by mouth every other day as needed for erectile dysfunction. 10 tablet 11   No current facility-administered medications for this visit.    Patient confirms/reports the following allergies:  Allergies  Allergen Reactions   Elemental Sulfur     No orders of the defined types were placed in this  encounter.   AUTHORIZATION INFORMATION Primary Insurance: 1D#: Group #:  Secondary Insurance: 1D#: Group #:  SCHEDULE INFORMATION: Date: 03/28/23 Time: Location: [facility]

## 2023-01-20 NOTE — Progress Notes (Signed)
Established Patient Office Visit  Name: Jason Duncan   MRN: [id number]    DOB: [birth year]   Date:01/20/2023  Today's Provider: Jacquelin Hawking, MHS, PA-C Introduced myself to the patient as a PA-C and provided education on APPs in clinical practice.         Subjective  Chief Complaint  Chief Complaint  Patient presents with   Follow-up   Hypertension   Hyperlipidemia    HPI  HYPERTENSION / HYPERLIPIDEMIA Satisfied with current treatment? yes Duration of hypertension: years BP monitoring frequency: not checking BP range: NA BP medication side effects: no Past BP meds: losartan (cozaar) taking daily  Duration of hyperlipidemia: chronic Cholesterol medication side effects: no denies side effects  Cholesterol supplements: none Past cholesterol medications: atorvastain (lipitor) Medication compliance: fair compliance taking about 3 times per week  Aspirin: no Recent stressors: Yes, he has recently started a new job  Recurrent headaches: yes Reports sinus pressure, intense pressure sensation in head last week but resolved with rest, antihistamines and OTC pain relievers.  Visual changes: yes reports flashes of light and blurry vision sometimes occurring around headache episodes  Palpitations: no Dyspnea: no Chest pain: no Lower extremity edema: no Dizzy/lightheaded: no   He reports concern for a rash on his left leg and buttocks  Reports it has been present since Thurs  States it is painful and thinks there are vesicles  He has tried covering with bandage and using OTC triple abx ointment  He reports there has not been any improvement in appearance with home measures He is concerned for Shingles  He thinks he was vaccinated for chickenpox but is unsure if he has ever had it    Patient Active Problem List   Diagnosis Date Noted   Hyperlipidemia 01/20/2023   Primary hypertension 07/22/2022   Chest pain 04/24/2021   Insomnia 06/28/2019   Tobacco chew use  06/28/2019   OSA (obstructive sleep apnea) 06/28/2019   Chronic pansinusitis 06/28/2019   Hiatal hernia with GERD 03/02/2019   Erectile dysfunction 06/24/2017   Vitamin D deficiency 06/24/2017    Past Surgical History:  Procedure Laterality Date   COLONOSCOPY WITH PROPOFOL N/A 08/03/2019   Procedure: COLONOSCOPY WITH PROPOFOL;  Surgeon: [name], MD;  Location: [facility] ENDOSCOPY;  Service: Gastroenterology;  Laterality: N/A;   EXTERNAL EAR SURGERY Bilateral 1973   LEG SURGERY Right 1988   rods placed due to broken leg   SHOULDER SURGERY Right 2003    Family History  Problem Relation Age of Onset   Hypertension Mother    Pneumonia Maternal Grandmother    Cancer Maternal Grandfather        lung   Heart disease Brother        45 years old, died from a heart attack    Social History   Tobacco Use   Smoking status: Never   Smokeless tobacco: Current    Types: Chew  Substance Use Topics   Alcohol use: No     Current Outpatient Medications:    acyclovir (ZOVIRAX) 800 MG tablet, Take 1 tablet (800 mg total) by mouth 5 (five) times daily for 7 days., Disp: 35 tablet, Rfl: 0   atorvastatin (LIPITOR) 10 MG tablet, Take 1 tablet (10 mg total) by mouth at bedtime. (Patient not taking: Reported on 01/20/2023), Disp: 90 tablet, Rfl: 3   gabapentin (NEURONTIN) 100 MG capsule, Take 1 capsule (100 mg total) by mouth 3 (three) times daily as needed.,  Disp: 30 capsule, Rfl: 0   losartan (COZAAR) 25 MG tablet, Take 1 tablet (25 mg total) by mouth daily., Disp: 90 tablet, Rfl: 1   tadalafil (CIALIS) 20 MG tablet, Take 0.5-1 tablets (10-20 mg total) by mouth every other day as needed for erectile dysfunction., Disp: 10 tablet, Rfl: 11   pantoprazole (PROTONIX) 40 MG tablet, Take 1 tablet (40 mg total) by mouth 2 (two) times daily. (Patient not taking: Reported on 01/20/2023), Disp: 60 tablet, Rfl: 3  Allergies  Allergen Reactions   Elemental Sulfur     I personally reviewed active problem  list, medication list, allergies, health maintenance, notes from last encounter, lab results with the patient/caregiver today.   Review of Systems  HENT:  Positive for sinus pain.   Eyes:  Positive for blurred vision.  Respiratory:  Negative for shortness of breath and wheezing.   Cardiovascular:  Negative for chest pain, palpitations and leg swelling.  Neurological:  Positive for headaches. Negative for dizziness.      Objective  Vitals:   01/20/23 1413  BP: 126/78  Pulse: 85  Resp: 16  Temp: 98.1 F (36.7 C)  TempSrc: Oral  SpO2: 97%  Weight: 194 lb 9.6 oz (88.3 kg)  Height: 5\' 9"  (1.753 m)    Body mass index is 28.74 kg/m.  Physical Exam Vitals reviewed.  Constitutional:      General: He is awake.     Appearance: Normal appearance. He is well-developed.  HENT:     Head: Normocephalic and atraumatic.  Eyes:     General: Lids are normal. Gaze aligned appropriately.     Extraocular Movements: Extraocular movements intact.     Conjunctiva/sclera: Conjunctivae normal.  Cardiovascular:     Rate and Rhythm: Normal rate and regular rhythm.     Pulses: Normal pulses.          Radial pulses are 2+ on the right side and 2+ on the left side.     Heart sounds: Normal heart sounds. No murmur heard.    No friction rub. No gallop.  Pulmonary:     Effort: Pulmonary effort is normal.     Breath sounds: Normal breath sounds. No decreased air movement. No decreased breath sounds, wheezing, rhonchi or rales.  Musculoskeletal:     Cervical back: Normal range of motion.     Right lower leg: No edema.     Left lower leg: No edema.  Lymphadenopathy:     Head:     Right side of head: No submental, submandibular or preauricular adenopathy.     Left side of head: No submental, submandibular or preauricular adenopathy.     Cervical:     Right cervical: No superficial or posterior cervical adenopathy.    Left cervical: No superficial or posterior cervical adenopathy.     Upper  Body:     Right upper body: No supraclavicular adenopathy.     Left upper body: No supraclavicular adenopathy.  Skin:    General: Skin is warm and dry.     Findings: Rash present. Rash is crusting, papular and vesicular.       Neurological:     General: No focal deficit present.     Mental Status: He is alert and oriented to person, place, and time.     GCS: GCS eye subscore is 4. GCS verbal subscore is 5. GCS motor subscore is 6.  Psychiatric:        Behavior: Behavior is cooperative.  No results found for this or any previous visit (from the past 2160 hour(s)).   PHQ2/9:    01/20/2023    2:09 PM 07/22/2022    1:55 PM 01/28/2022    9:05 AM 12/20/2021    3:49 PM 07/19/2021    8:12 AM  Depression screen PHQ 2/9  Decreased Interest 1 0 0 0 0  Down, Depressed, Hopeless 1 0 0 0 0  PHQ - 2 Score 2 0 0 0 0  Altered sleeping 3 0 0 0 0  Tired, decreased energy 1 0 0 0 0  Change in appetite 0 0 0 0 0  Feeling bad or failure about yourself  0 0 0 0 0  Trouble concentrating 1 0 0 0 0  Moving slowly or fidgety/restless 0 0 0 0 0  Suicidal thoughts 0 0 0 0 0  PHQ-9 Score 7 0 0 0 0  Difficult doing work/chores Somewhat difficult Not difficult at all  Not difficult at all Not difficult at all      Fall Risk:    01/20/2023    2:08 PM 07/22/2022    1:55 PM 01/28/2022    9:04 AM 12/20/2021    3:49 PM 07/19/2021    8:12 AM  Fall Risk   Falls in the past year? 0 0 0 0 0  Number falls in past yr: 0 0 0 0 0  Injury with Fall? 0 0 0 0 0  Risk for fall due to : No Fall Risks No Fall Risks No Fall Risks No Fall Risks No Fall Risks  Follow up Falls prevention discussed;Education provided;Falls evaluation completed Falls evaluation completed;Education provided;Falls prevention discussed Falls prevention discussed Falls prevention discussed Falls prevention discussed      Functional Status Survey: Is the patient deaf or have difficulty hearing?: No Does the patient have difficulty seeing,  even when wearing glasses/contacts?: No Does the patient have difficulty concentrating, remembering, or making decisions?: Yes Does the patient have difficulty walking or climbing stairs?: No Does the patient have difficulty dressing or bathing?: No Does the patient have difficulty doing errands alone such as visiting a doctor's office or shopping?: No    Assessment & Plan  Problem List Items Addressed This Visit       Cardiovascular and Mediastinum   Primary hypertension - Primary    Chronic, historic condition Patient is currently taking losartan 25 mg p.o. daily and appears to be tolerating well. Patient's BP is within goal in office today.  He reports that he is not checking at home. Physical exam is overall reassuring Continue current medication regimen Follow-up in 6 months or sooner if concerns arise        Other   Hyperlipidemia    Chronic, historic condition Patient is currently prescribed atorvastatin 10 mg p.o. nightly.  He reports that he is not taking consistently and may be takes atorvastatin 2-3 times per week due to forgetting it before bedtime. Recommend that he tries to improve consistency to assist with appropriate cholesterol management. Recheck cholesterol at physical in 6 months       Other Visit Diagnoses     Herpes zoster without complication     Acute, new concern. Patient reports that he first noticed the rash on Thursday Patient reports that he noticed a papulovesicular rash along the left side of his groin as well as left thigh that is painful and burns.  He also reports some associated discomfort down the left side of his leg and  into the buttocks. He reports that he has been treating with OTC triple antibiotic ointment and covering the area with a bandage. Rash appears consistent with crusted shingles eruption. We reviewed shingles disease course as well as transmission reduction protocols. Will provide prescription of acyclovir 800 mg p.o. 5  times per day for 7 days as well as 30 tablets of gabapentin 100 mg to be used p.o. 3 times daily as needed Reviewed care measures to prevent secondary infection of rash Follow-up as needed for persistent or progressing symptoms   Relevant Medications   acyclovir (ZOVIRAX) 800 MG tablet   gabapentin (NEURONTIN) 100 MG capsule   Screening for colon cancer       Relevant Orders   Ambulatory referral to Gastroenterology        Return in about 6 months (around 07/23/2023) for HTN,HLD, physical.   I, Stana Bayon E Jachai Okazaki, PA-C, have reviewed all documentation for this visit. The documentation on 01/20/23 for the exam, diagnosis, procedures, and orders are all accurate and complete.   Jacquelin Hawking, MHS, PA-C [facility]

## 2023-01-20 NOTE — Assessment & Plan Note (Signed)
Chronic, historic condition Patient is currently prescribed atorvastatin 10 mg p.o. nightly.  He reports that he is not taking consistently and may be takes atorvastatin 2-3 times per week due to forgetting it before bedtime. Recommend that he tries to improve consistency to assist with appropriate cholesterol management. Recheck cholesterol at physical in 6 months

## 2023-01-20 NOTE — Assessment & Plan Note (Signed)
Chronic, historic condition Patient is currently taking losartan 25 mg p.o. daily and appears to be tolerating well. Patient's BP is within goal in office today.  He reports that he is not checking at home. Physical exam is overall reassuring Continue current medication regimen Follow-up in 6 months or sooner if concerns arise

## 2023-01-20 NOTE — Patient Instructions (Addendum)
  Please try to avoid touching your shingles rash and wash your hands after doing so Once the rash has scabbed over you are usually no longer contagious   You can take the Gabapentin as needed for nerve pain up to three times per day  Please take the Acyclovir as directed to help reduce the viral load and shedding.   You can keep the area clean and dry- use gentle soap and warm water for cleansing You do not need to put any topical treatments on the area- antibiotic ointments can make the irritation worse and usually are not needed DO not use alcohol or peroxide on the area as this will inflame it further as well

## 2023-03-02 ENCOUNTER — Other Ambulatory Visit: Payer: Self-pay | Admitting: Family Medicine

## 2023-03-02 DIAGNOSIS — I10 Essential (primary) hypertension: Secondary | ICD-10-CM

## 2023-03-24 ENCOUNTER — Telehealth: Payer: Self-pay

## 2023-03-24 DIAGNOSIS — Z86010 Personal history of colonic polyps: Secondary | ICD-10-CM

## 2023-03-24 NOTE — Telephone Encounter (Signed)
Returned patients call to reschedule his 04/02/23 colonoscopy with Dr. Tobi Bastos due to starting a new job.  He has also requested to change prep.  Stated that Suprep is not being covered by his insurance.  I explained to him that all preps are not covered and it has to be a preferred prep to be covered.  Informed him that I will change prep to Golytely prep and update instructions to have this reflected with new procedure date of 07/11/23. Called pharmacist and they confirmed prep is only $4.  Therefore prep has not changed.  Asked him to call me back if he has any other questions or concerns.  Raynelle Fanning in Endo has been informed of date change. Referral updated.  Thanks, [place], [place]

## 2023-07-08 ENCOUNTER — Telehealth: Payer: Self-pay

## 2023-07-08 NOTE — Telephone Encounter (Signed)
Patient has been contacted to reschedule 07/11/23 colonoscopy due to schedule change (procedures full).  Patient has agreed to reschedule to later date since started a new job.  Colonoscopy has been rescheduled to 08/29/23 at [facility] with Dr. Tobi Bastos.  Instructions updated.  Referral updated.  Thanks,  [place], [place]

## 2023-07-20 ENCOUNTER — Other Ambulatory Visit: Payer: Self-pay | Admitting: Family Medicine

## 2023-07-20 DIAGNOSIS — I10 Essential (primary) hypertension: Secondary | ICD-10-CM

## 2023-07-25 ENCOUNTER — Encounter: Payer: BC Managed Care – PPO | Admitting: Family Medicine

## 2023-07-29 ENCOUNTER — Encounter: Payer: Self-pay | Admitting: Physician Assistant

## 2023-07-29 ENCOUNTER — Ambulatory Visit (INDEPENDENT_AMBULATORY_CARE_PROVIDER_SITE_OTHER): Payer: BC Managed Care – PPO | Admitting: Physician Assistant

## 2023-07-29 VITALS — BP 132/84 | HR 67 | Temp 97.8°F | Resp 16 | Ht 69.0 in | Wt 202.4 lb

## 2023-07-29 DIAGNOSIS — N529 Male erectile dysfunction, unspecified: Secondary | ICD-10-CM | POA: Diagnosis not present

## 2023-07-29 DIAGNOSIS — Z0000 Encounter for general adult medical examination without abnormal findings: Secondary | ICD-10-CM | POA: Diagnosis not present

## 2023-07-29 MED ORDER — TADALAFIL 20 MG PO TABS: 10.0000 mg | ORAL_TABLET | ORAL | 5 refills | Status: AC | PRN

## 2023-07-29 NOTE — Assessment & Plan Note (Signed)
Refills provided 

## 2023-07-29 NOTE — Patient Instructions (Addendum)
Please reach out to [facility] to discuss your CPAP machine to see if they can help with the discomfort

## 2023-07-29 NOTE — Progress Notes (Signed)
Annual Physical Exam   Name: Jason Duncan   MRN: [id number]    DOB: 1966/04/10   Date:07/29/2023  Today's Provider: Jacquelin Hawking, MHS, PA-C Introduced myself to the patient as a PA-C and provided education on APPs in clinical practice.         Subjective  Chief Complaint  Chief Complaint  Patient presents with   Annual Exam    HPI  Patient presents for annual CPE .   Diet: overall normal dietary intake.  Exercise: he is not engaged in regular exercise. He reports he walks a lot at work  Sleep:"sleep's not very good, my legs hurt at night and that keeps me awake" avg of about 6-7 hours per night, does not feel well rested in the AM. He does not use his CPAP due to discomfort  Mood: "I'm alright"     IPSS     Row Name 07/29/23 1258         International Prostate Symptom Score   How often have you had the sensation of not emptying your bladder? Not at All     How often have you had to urinate less than every two hours? Not at All     How often have you found you stopped and started again several times when you urinated? Not at All     How often have you found it difficult to postpone urination? About half the time     How often have you had a weak urinary stream? Not at All     How often have you had to strain to start urination? Not at All     How many times did you typically get up at night to urinate? 1 Time     Total IPSS Score 4       Quality of Life due to urinary symptoms   If you were to spend the rest of your life with your urinary condition just the way it is now how would you feel about that? Mostly Satisfied                Depression: phq 9 is overall negative/ minimal    07/29/2023   12:52 PM 01/20/2023    2:09 PM 07/22/2022    1:55 PM 01/28/2022    9:05 AM 12/20/2021    3:49 PM  Depression screen PHQ 2/9  Decreased Interest 1 1 0 0 0  Down, Depressed, Hopeless 1 1 0 0 0  PHQ - 2 Score 2 2 0 0 0  Altered sleeping 1 3 0 0 0  Tired,  decreased energy 1 1 0 0 0  Change in appetite 0 0 0 0 0  Feeling bad or failure about yourself  0 0 0 0 0  Trouble concentrating 0 1 0 0 0  Moving slowly or fidgety/restless 0 0 0 0 0  Suicidal thoughts 0 0 0 0 0  PHQ-9 Score 4 7 0 0 0  Difficult doing work/chores Somewhat difficult Somewhat difficult Not difficult at all  Not difficult at all    Hypertension:  BP Readings from Last 3 Encounters:  07/29/23 132/84  01/20/23 126/78  07/22/22 122/80    Obesity: Wt Readings from Last 3 Encounters:  07/29/23 202 lb 6.4 oz (91.8 kg)  01/20/23 194 lb 9.6 oz (88.3 kg)  07/22/22 220 lb 6.4 oz (100 kg)   BMI Readings from Last 3 Encounters:  07/29/23 29.89 kg/m  01/20/23 28.74  kg/m  07/22/22 32.55 kg/m     Lipids:  Lab Results  Component Value Date   CHOL 168 07/22/2022   CHOL 153 07/19/2021   CHOL 148 07/18/2020   Lab Results  Component Value Date   HDL 38 (L) 07/22/2022   HDL 38 (L) 07/19/2021   HDL 35 (L) 07/18/2020   Lab Results  Component Value Date   LDLCALC 100 (H) 07/22/2022   LDLCALC 99 07/19/2021   LDLCALC 94 07/18/2020   Lab Results  Component Value Date   TRIG 179 (H) 07/22/2022   TRIG 74 07/19/2021   TRIG 91 07/18/2020   Lab Results  Component Value Date   CHOLHDL 4.4 07/22/2022   CHOLHDL 4.0 07/19/2021   CHOLHDL 4.2 07/18/2020   No results found for: "LDLDIRECT" Glucose:  Glucose  Date Value Ref Range Status  07/27/2012 100 (H) 65 - 99 mg/dL Final   Glucose, Bld  Date Value Ref Range Status  07/22/2022 69 65 - 99 mg/dL Final    Comment:    .            Fasting reference interval .   12/20/2021 79 65 - 99 mg/dL Final    Comment:    .            Fasting reference interval .   07/19/2021 80 65 - 99 mg/dL Final    Comment:    .            Fasting reference interval .     Flowsheet Row Office Visit from 07/22/2022 in [facility]  AUDIT-C Score 0        Married STD testing and prevention  (HIV/chl/gon/syphilis):  no declines screening today  Skin cancer: Discussed monitoring for atypical lesions Colorectal cancer screening: Upcoming colonoscopy scheduled for Dec 13th 2024 Prostate cancer screening:  ordered Lab Results  Component Value Date   PSA 1.50 07/22/2022   PSA 1.05 07/19/2021   PSA 1.0 06/28/2019     Lung cancer:  Low Dose CT Chest recommended if Age 30-80 years, 30 pack-year currently smoking OR have quit w/in 15years. Patient  not applicable- he has never smoked  AAA: The USPSTF recommends one-time screening with ultrasonography in men ages 76 to 57 years who have ever smoked. Patient:  not applicable ECG:  NA  Health Maintenance  Topic Date Due   COVID-19 Vaccine (5 - 2023-24 season) 08/14/2023*   DTaP/Tdap/Td vaccine (1 - Tdap) 07/28/2024*   Colon Cancer Screening  07/28/2024*   Flu Shot  Completed   Hepatitis C Screening  Completed   HIV Screening  Completed   HPV Vaccine  Aged Out   Zoster (Shingles) Vaccine  Discontinued  *Topic was postponed. The date shown is not the original due date.     Advanced Care Planning: A voluntary discussion about advance care planning including the explanation and discussion of advance directives.  Discussed health care proxy and Living will, and the patient was able to identify a health care proxy as no one.  Patient does not have a living will in effect at this time.Declines paper work today   Patient Active Problem List   Diagnosis Date Noted   Hyperlipidemia 01/20/2023   Primary hypertension 07/22/2022   Chest pain 04/24/2021   Insomnia 06/28/2019   Tobacco chew use 06/28/2019   OSA (obstructive sleep apnea) 06/28/2019   Chronic pansinusitis 06/28/2019   Hiatal hernia with GERD 03/02/2019   Erectile dysfunction 06/24/2017  Vitamin D deficiency 06/24/2017    Past Surgical History:  Procedure Laterality Date   COLONOSCOPY WITH PROPOFOL N/A 08/03/2019   Procedure: COLONOSCOPY WITH PROPOFOL;  Surgeon:  [name], MD;  Location: [facility] ENDOSCOPY;  Service: Gastroenterology;  Laterality: N/A;   EXTERNAL EAR SURGERY Bilateral 1973   LEG SURGERY Right 1988   rods placed due to broken leg   SHOULDER SURGERY Right 2003    Family History  Problem Relation Age of Onset   Hypertension Mother    Pneumonia Maternal Grandmother    Cancer Maternal Grandfather        lung   Heart disease Brother        97 years old, died from a heart attack    Social History   Socioeconomic History   Marital status: Married    Spouse name: [name]    Number of children: 3   Years of education: Not on file   Highest education level: Some college, no degree  Occupational History   Not on file  Tobacco Use   Smoking status: Never   Smokeless tobacco: Current    Types: Chew  Vaping Use   Vaping status: Never Used  Substance and Sexual Activity   Alcohol use: No   Drug use: No    Comment: used to smoke pot from [age] y old    Sexual activity: Yes  Other Topics Concern   Not on file  Social History Narrative   Not on file   Social Determinants of Health   Financial Resource Strain: Low Risk  (07/22/2022)   Overall Financial Resource Strain (CARDIA)    Difficulty of Paying Living Expenses: Not hard at all  Food Insecurity: No Food Insecurity (07/22/2022)   Hunger Vital Sign    Worried About Running Out of Food in the Last Year: Never true    Ran Out of Food in the Last Year: Never true  Transportation Needs: No Transportation Needs (07/22/2022)   PRAPARE - Administrator, Civil Service (Medical): No    Lack of Transportation (Non-Medical): No  Physical Activity: Insufficiently Active (07/22/2022)   Exercise Vital Sign    Days of Exercise per Week: 2 days    Minutes of Exercise per Session: 30 min  Stress: Stress Concern Present (07/22/2022)   [employer] of Occupational Health - Occupational Stress Questionnaire    Feeling of Stress : To some extent  Social Connections:  Unknown (07/22/2022)   Social Connection and Isolation Panel [NHANES]    Frequency of Communication with Friends and Family: More than three times a week    Frequency of Social Gatherings with Friends and Family: Twice a week    Attends Religious Services: Not on Marketing executive or Organizations: No    Attends Banker Meetings: Never    Marital Status: Married  Catering manager Violence: Not At Risk (07/22/2022)   Humiliation, Afraid, Rape, and Kick questionnaire    Fear of Current or Ex-Partner: No    Emotionally Abused: No    Physically Abused: No    Sexually Abused: No     Current Outpatient Medications:    atorvastatin (LIPITOR) 10 MG tablet, Take 1 tablet (10 mg total) by mouth at bedtime. (Patient not taking: Reported on 01/20/2023), Disp: 90 tablet, Rfl: 3   losartan (COZAAR) 25 MG tablet, Take 1 tablet by mouth once daily, Disp: 30 tablet, Rfl: 0   pantoprazole (PROTONIX) 40 MG tablet, Take 1  tablet (40 mg total) by mouth 2 (two) times daily. (Patient not taking: Reported on 01/20/2023), Disp: 60 tablet, Rfl: 3   tadalafil (CIALIS) 20 MG tablet, Take 0.5-1 tablets (10-20 mg total) by mouth every other day as needed for erectile dysfunction., Disp: 10 tablet, Rfl: 5  Allergies  Allergen Reactions   Elemental Sulfur      Review of Systems  Constitutional:  Positive for malaise/fatigue. Negative for chills, fever and weight loss.  HENT:  Negative for hearing loss, nosebleeds, sore throat and tinnitus.   Eyes:  Negative for blurred vision, double vision and photophobia.  Respiratory:  Negative for cough, shortness of breath and wheezing.   Cardiovascular:  Negative for chest pain, palpitations and leg swelling.  Gastrointestinal:  Positive for heartburn. Negative for blood in stool, constipation, diarrhea, nausea and vomiting.  Genitourinary:  Negative for dysuria and frequency.  Musculoskeletal:  Positive for myalgias (legs hurt at night). Negative for  falls and joint pain.  Skin:  Negative for itching and rash.  Neurological:  Positive for headaches (reports hx of infrequent migraines). Negative for dizziness, tingling, tremors, loss of consciousness and weakness.  Psychiatric/Behavioral:  Negative for depression, memory loss and suicidal ideas. The patient is not nervous/anxious.        Objective  Vitals:   07/29/23 1252  BP: 132/84  Pulse: 67  Resp: 16  Temp: 97.8 F (36.6 C)  TempSrc: Oral  SpO2: 98%  Weight: 202 lb 6.4 oz (91.8 kg)  Height: 5\' 9"  (1.753 m)    Body mass index is 29.89 kg/m.  Physical Exam Vitals reviewed.  Constitutional:      General: He is awake.     Appearance: Normal appearance. He is well-developed and well-groomed.  HENT:     Head: Normocephalic and atraumatic.     Right Ear: Hearing, tympanic membrane and ear canal normal.     Left Ear: Hearing, tympanic membrane and ear canal normal.     Nose: Nose normal.     Mouth/Throat:     Lips: Pink.     Mouth: Mucous membranes are moist. No lacerations or oral lesions.     Pharynx: Oropharynx is clear. Uvula midline. No pharyngeal swelling, oropharyngeal exudate or posterior oropharyngeal erythema.  Eyes:     General: Lids are normal. Gaze aligned appropriately.     Extraocular Movements: Extraocular movements intact.     Right eye: Normal extraocular motion and no nystagmus.     Left eye: Normal extraocular motion and no nystagmus.     Conjunctiva/sclera: Conjunctivae normal.     Pupils: Pupils are equal, round, and reactive to light.  Neck:     Thyroid: No thyroid mass, thyromegaly or thyroid tenderness.     Trachea: Phonation normal.  Cardiovascular:     Rate and Rhythm: Normal rate and regular rhythm.     Pulses: Normal pulses.          Radial pulses are 2+ on the right side and 2+ on the left side.     Heart sounds: Normal heart sounds. No murmur heard.    No friction rub. No gallop.  Pulmonary:     Effort: Pulmonary effort is  normal.     Breath sounds: Normal breath sounds. No decreased air movement. No decreased breath sounds, wheezing, rhonchi or rales.  Abdominal:     General: Abdomen is flat. Bowel sounds are normal.     Palpations: Abdomen is soft.     Tenderness: There is no abdominal  tenderness.  Musculoskeletal:     Cervical back: Normal range of motion and neck supple.     Right lower leg: No edema.     Left lower leg: No edema.  Lymphadenopathy:     Head:     Right side of head: No submental, submandibular or preauricular adenopathy.     Left side of head: No submental, submandibular or preauricular adenopathy.     Cervical: No cervical adenopathy.     Right cervical: No superficial or posterior cervical adenopathy.    Left cervical: No superficial or posterior cervical adenopathy.     Upper Body:     Right upper body: No supraclavicular adenopathy.     Left upper body: No supraclavicular adenopathy.  Skin:    General: Skin is warm and dry.  Neurological:     General: No focal deficit present.     Mental Status: He is alert and oriented to person, place, and time. Mental status is at baseline.     GCS: GCS eye subscore is 4. GCS verbal subscore is 5. GCS motor subscore is 6.     Cranial Nerves: No cranial nerve deficit, dysarthria or facial asymmetry.     Motor: No weakness, tremor, atrophy or abnormal muscle tone.     Gait: Gait is intact.     Deep Tendon Reflexes:     Reflex Scores:      Patellar reflexes are 2+ on the right side and 2+ on the left side. Psychiatric:        Attention and Perception: Attention and perception normal.        Mood and Affect: Mood and affect normal.        Speech: Speech normal.        Behavior: Behavior normal. Behavior is cooperative.        Thought Content: Thought content normal.        Cognition and Memory: Cognition normal.        Judgment: Judgment normal.      No results found for this or any previous visit (from the past 2160  hour(s)).   Fall Risk:    07/29/2023   12:52 PM 01/20/2023    2:08 PM 07/22/2022    1:55 PM 01/28/2022    9:04 AM 12/20/2021    3:49 PM  Fall Risk   Falls in the past year? 0 0 0 0 0  Number falls in past yr: 0 0 0 0 0  Injury with Fall? 0 0 0 0 0  Risk for fall due to : No Fall Risks No Fall Risks No Fall Risks No Fall Risks No Fall Risks  Follow up Falls prevention discussed;Education provided;Falls evaluation completed Falls prevention discussed;Education provided;Falls evaluation completed Falls evaluation completed;Education provided;Falls prevention discussed Falls prevention discussed Falls prevention discussed     Functional Status Survey: Is the patient deaf or have difficulty hearing?: No Does the patient have difficulty seeing, even when wearing glasses/contacts?: No Does the patient have difficulty concentrating, remembering, or making decisions?: No Does the patient have difficulty walking or climbing stairs?: No Does the patient have difficulty dressing or bathing?: No Does the patient have difficulty doing errands alone such as visiting a doctor's office or shopping?: No    Assessment & Plan  Problem List Items Addressed This Visit       Other   Erectile dysfunction    Refills provided       Relevant Medications   tadalafil (CIALIS) 20 MG tablet  Other Visit Diagnoses     Annual physical exam    -  Primary   Relevant Orders   TSH   Hemoglobin A1c   Lipid panel   CBC with Differential/Platelet   COMPLETE METABOLIC PANEL WITH GFR   PSA       -Prostate cancer screening and PSA options (with potential risks and benefits of testing vs not testing) were discussed along with recent recs/guidelines. -USPSTF grade A and B recommendations reviewed with patient; age-appropriate recommendations, preventive care, screening tests, etc discussed and encouraged; healthy living encouraged; see AVS for patient education given to patient -Discussed importance of 150  minutes of physical activity weekly, eat two servings of fish weekly, eat one serving of tree nuts ( cashews, pistachios, pecans, almonds.Marland Kitchen) every other day, eat 6 servings of fruit/vegetables daily and drink plenty of water and avoid sweet beverages.  -Reviewed Health Maintenance: yes  Return in about 6 months (around 01/26/2024) for HTN, HLD, OSA, GERD, ED.   I, Seraphine Gudiel E Gracee Ratterree, PA-C, have reviewed all documentation for this visit. The documentation on 07/29/23 for the exam, diagnosis, procedures, and orders are all accurate and complete.   Jacquelin Hawking, MHS, PA-C [facility]

## 2023-07-30 LAB — COMPLETE METABOLIC PANEL WITH GFR
AG Ratio: 2.3 (calc) (ref 1.0–2.5)
ALT: 19 U/L (ref 9–46)
AST: 14 U/L (ref 10–35)
Albumin: 5.1 g/dL (ref 3.6–5.1)
Alkaline phosphatase (APISO): 72 U/L (ref 35–144)
BUN: 17 mg/dL (ref 7–25)
CO2: 28 mmol/L (ref 20–32)
Calcium: 10.2 mg/dL (ref 8.6–10.3)
Chloride: 105 mmol/L (ref 98–110)
Creat: 0.96 mg/dL (ref 0.70–1.30)
Globulin: 2.2 g/dL (ref 1.9–3.7)
Glucose, Bld: 89 mg/dL (ref 65–99)
Potassium: 4.9 mmol/L (ref 3.5–5.3)
Sodium: 143 mmol/L (ref 135–146)
Total Bilirubin: 0.4 mg/dL (ref 0.2–1.2)
Total Protein: 7.3 g/dL (ref 6.1–8.1)
eGFR: 93 mL/min/{1.73_m2} (ref >=60)

## 2023-07-30 LAB — CBC WITH DIFFERENTIAL/PLATELET
Absolute Lymphocytes: 1724 {cells}/uL (ref 850–3900)
Absolute Monocytes: 372 {cells}/uL (ref 200–950)
Basophils Absolute: 37 {cells}/uL (ref 0–200)
Basophils Relative: 0.6 %
Eosinophils Absolute: 37 {cells}/uL (ref 15–500)
Eosinophils Relative: 0.6 %
HCT: 44.4 % (ref 38.5–50.0)
Hemoglobin: 15.2 g/dL (ref 13.2–17.1)
MCH: 31.3 pg (ref 27.0–33.0)
MCHC: 34.2 g/dL (ref 32.0–36.0)
MCV: 91.5 fL (ref 80.0–100.0)
MPV: 9.6 fL (ref 7.5–12.5)
Monocytes Relative: 6 %
Neutro Abs: 4030 {cells}/uL (ref 1500–7800)
Neutrophils Relative %: 65 %
Platelets: 314 10*3/uL (ref 140–400)
RBC: 4.85 10*6/uL (ref 4.20–5.80)
RDW: 12.4 % (ref 11.0–15.0)
Total Lymphocyte: 27.8 %
WBC: 6.2 10*3/uL (ref 3.8–10.8)

## 2023-07-30 LAB — TSH: TSH: 1.7 m[IU]/L (ref 0.40–4.50)

## 2023-07-30 LAB — LIPID PANEL
Cholesterol: 172 mg/dL (ref <200)
HDL: 38 mg/dL — ABNORMAL LOW (ref >=40)
LDL Cholesterol (Calc): 106 mg/dL — ABNORMAL HIGH
Non-HDL Cholesterol (Calc): 134 mg/dL — ABNORMAL HIGH (ref <130)
Total CHOL/HDL Ratio: 4.5 (calc) (ref <5.0)
Triglycerides: 164 mg/dL — ABNORMAL HIGH (ref <150)

## 2023-07-30 LAB — HEMOGLOBIN A1C
Hgb A1c MFr Bld: 5.6 %{Hb} (ref <5.7)
Mean Plasma Glucose: 114 mg/dL
eAG (mmol/L): 6.3 mmol/L

## 2023-07-30 LAB — PSA: PSA: 1.45 ng/mL (ref <=4.00)

## 2023-07-31 NOTE — Progress Notes (Signed)
Your labs are back Your Cholesterol is overall stable since it was checked last year. If tolerable I recommend taking your statin again about 3 times per week or starting a supplement such as fish oil or red yeast rice to help manage it  Your thyroid testing was normal Your A1c was 5.6% which is on the upper end of normal- I recommend reducing your sugar and carb intake to help bring this down Your CBC was overall normal- no signs of anemia Your electrolytes, liver and kidney function were overall normal at this time Your prostate testing was normal Continue your current medications as directed and let us know if you have further questions or concerns

## 2023-08-26 ENCOUNTER — Encounter: Payer: Self-pay | Admitting: Gastroenterology

## 2023-08-26 ENCOUNTER — Other Ambulatory Visit: Payer: Self-pay

## 2023-08-26 MED ORDER — NA SULFATE-K SULFATE-MG SULF 17.5-3.13-1.6 GM/177ML PO SOLN: 354.0000 mL | Freq: Once | ORAL | 0 refills | Status: AC

## 2023-08-29 ENCOUNTER — Encounter
Admission: RE | Disposition: A | Discharge status: Home / Self Care | Payer: Self-pay | Source: Home / Self Care | Attending: Gastroenterology

## 2023-08-29 ENCOUNTER — Other Ambulatory Visit: Payer: Self-pay

## 2023-08-29 ENCOUNTER — Ambulatory Visit: Payer: BC Managed Care – PPO | Admitting: Certified Registered Nurse Anesthetist

## 2023-08-29 ENCOUNTER — Ambulatory Visit
Admission: RE | Admit: 2023-08-29 | Discharge: 2023-08-29 | Disposition: A | Discharge status: Home / Self Care | Payer: BC Managed Care – PPO | Attending: Gastroenterology | Admitting: Gastroenterology

## 2023-08-29 ENCOUNTER — Encounter: Payer: Self-pay | Admitting: Gastroenterology

## 2023-08-29 DIAGNOSIS — I10 Essential (primary) hypertension: Secondary | ICD-10-CM | POA: Diagnosis not present

## 2023-08-29 DIAGNOSIS — Z1211 Encounter for screening for malignant neoplasm of colon: Secondary | ICD-10-CM | POA: Diagnosis not present

## 2023-08-29 DIAGNOSIS — K635 Polyp of colon: Secondary | ICD-10-CM | POA: Diagnosis not present

## 2023-08-29 DIAGNOSIS — G4730 Sleep apnea, unspecified: Secondary | ICD-10-CM | POA: Diagnosis not present

## 2023-08-29 DIAGNOSIS — D120 Benign neoplasm of cecum: Secondary | ICD-10-CM | POA: Insufficient documentation

## 2023-08-29 DIAGNOSIS — F17220 Nicotine dependence, chewing tobacco, uncomplicated: Secondary | ICD-10-CM | POA: Diagnosis not present

## 2023-08-29 DIAGNOSIS — D126 Benign neoplasm of colon, unspecified: Secondary | ICD-10-CM

## 2023-08-29 DIAGNOSIS — K219 Gastro-esophageal reflux disease without esophagitis: Secondary | ICD-10-CM | POA: Insufficient documentation

## 2023-08-29 DIAGNOSIS — Z860100 Personal history of colon polyps, unspecified: Secondary | ICD-10-CM

## 2023-08-29 HISTORY — PX: POLYPECTOMY: SHX5525

## 2023-08-29 HISTORY — PX: COLONOSCOPY WITH PROPOFOL: SHX5780

## 2023-08-29 SURGERY — COLONOSCOPY WITH PROPOFOL
Anesthesia: General

## 2023-08-29 MED ORDER — LIDOCAINE HCL (PF) 2 % IJ SOLN
INTRAMUSCULAR | Status: AC
Start: 2023-08-29 — End: ?
  Filled 2023-08-29: qty 10

## 2023-08-29 MED ORDER — PROPOFOL 500 MG/50ML IV EMUL
INTRAVENOUS | Status: DC | PRN
  Administered 2023-08-29: 160 ug/kg/min via INTRAVENOUS

## 2023-08-29 MED ORDER — PROPOFOL 10 MG/ML IV BOLUS
INTRAVENOUS | Status: DC | PRN
  Administered 2023-08-29: 80 mg via INTRAVENOUS

## 2023-08-29 MED ORDER — GLYCOPYRROLATE 0.2 MG/ML IJ SOLN
INTRAMUSCULAR | Status: AC
  Filled 2023-08-29: qty 1

## 2023-08-29 MED ORDER — PROPOFOL 1000 MG/100ML IV EMUL
INTRAVENOUS | Status: AC
  Filled 2023-08-29: qty 400

## 2023-08-29 MED ORDER — STERILE WATER FOR IRRIGATION IR SOLN
Status: DC | PRN
  Administered 2023-08-29: 100 mL

## 2023-08-29 MED ORDER — SODIUM CHLORIDE 0.9 % IV SOLN: INTRAVENOUS | Status: DC

## 2023-08-29 NOTE — Anesthesia Preprocedure Evaluation (Signed)
Anesthesia Evaluation  Patient identified by MRN, date of birth, ID band Patient awake    Reviewed: Allergy & Precautions, NPO status , Patient's Chart, lab work & pertinent test results  History of Anesthesia Complications Negative for: history of anesthetic complications  Airway Mallampati: II  TM Distance: >3 FB Neck ROM: full    Dental  (+) Upper Dentures, Lower Dentures   Pulmonary sleep apnea    Pulmonary exam normal        Cardiovascular hypertension, On Medications Normal cardiovascular exam     Neuro/Psych negative neurological ROS  negative psych ROS   GI/Hepatic Neg liver ROS, hiatal hernia,GERD  Medicated,,  Endo/Other  negative endocrine ROS    Renal/GU negative Renal ROS  negative genitourinary   Musculoskeletal   Abdominal   Peds  Hematology negative hematology ROS (+)   Anesthesia Other Findings Past Medical History: No date: Erectile dysfunction 06/24/2017: Erectile dysfunction No date: GERD (gastroesophageal reflux disease) No date: History of kidney stones No date: Kidney stones  Past Surgical History: 08/03/2019: COLONOSCOPY WITH PROPOFOL; N/A     Comment:  Procedure: COLONOSCOPY WITH PROPOFOL;  Surgeon: [name], MD;  Location: [facility] ENDOSCOPY;  Service:               Gastroenterology;  Laterality: N/A; 1973: EXTERNAL EAR SURGERY; Bilateral 1988: LEG SURGERY; Right     Comment:  rods placed due to broken leg 2003: SHOULDER SURGERY; Right     Reproductive/Obstetrics negative OB ROS                             Anesthesia Physical Anesthesia Plan  ASA: 3  Anesthesia Plan: General   Post-op Pain Management: Minimal or no pain anticipated   Induction: Intravenous  PONV Risk Score and Plan: 2 and Propofol infusion and TIVA  Airway Management Planned: Natural Airway and Nasal Cannula  Additional Equipment:   Intra-op Plan:    Post-operative Plan:   Informed Consent: I have reviewed the patients History and Physical, chart, labs and discussed the procedure including the risks, benefits and alternatives for the proposed anesthesia with the patient or authorized representative who has indicated his/her understanding and acceptance.     Dental Advisory Given  Plan Discussed with: Anesthesiologist, CRNA and Surgeon  Anesthesia Plan Comments: (Patient consented for risks of anesthesia including but not limited to:  - adverse reactions to medications - risk of airway placement if required - damage to eyes, teeth, lips or other oral mucosa - nerve damage due to positioning  - sore throat or hoarseness - Damage to heart, brain, nerves, lungs, other parts of body or loss of life  Patient voiced understanding and assent.)       Anesthesia Quick Evaluation

## 2023-08-29 NOTE — Anesthesia Postprocedure Evaluation (Signed)
Anesthesia Post Note  Patient: Jason Duncan  Procedure(s) Performed: COLONOSCOPY WITH PROPOFOL POLYPECTOMY  Patient location during evaluation: Endoscopy Anesthesia Type: General Level of consciousness: awake and alert Pain management: pain level controlled Vital Signs Assessment: post-procedure vital signs reviewed and stable Respiratory status: spontaneous breathing, nonlabored ventilation, respiratory function stable and patient connected to nasal cannula oxygen Cardiovascular status: blood pressure returned to baseline and stable Postop Assessment: no apparent nausea or vomiting Anesthetic complications: no   No notable events documented.   Last Vitals:  Vitals:   08/29/23 0816 08/29/23 0846  BP: 104/72 123/87  Pulse: 79   Resp: 17   Temp: 36.4 C   SpO2: 96%     Last Pain:  Vitals:   08/29/23 0846  TempSrc:   PainSc: 0-No pain                 Louie Boston

## 2023-08-29 NOTE — Op Note (Signed)
[facility] Gastroenterology Patient Name: Jason Duncan Procedure Date: 08/29/2023 7:11 AM MRN: [id number] Account #: [id number] Date of Birth: February 15, 1966 Admit Type: Outpatient Age: 57 Room: [facility] ENDO ROOM 4 Gender: Male Note Status: Finalized Instrument Name: Prentice Docker [id number] Procedure:             Colonoscopy Indications:           Surveillance: Personal history of adenomatous polyps                         on last colonoscopy > 3 years ago, Last colonoscopy:                         November 2020 Providers:             [name] MD, MD Referring MD:          [name] (Referring MD) Medicines:             Monitored Anesthesia Care Complications:         No immediate complications. Procedure:             Pre-Anesthesia Assessment:                        - Prior to the procedure, a History and Physical was                         performed, and patient medications, allergies and                         sensitivities were reviewed. The patient's tolerance                         of previous anesthesia was reviewed.                        - The risks and benefits of the procedure and the                         sedation options and risks were discussed with the                         patient. All questions were answered and informed                         consent was obtained.                        - ASA Grade Assessment: II - A patient with mild                         systemic disease.                        After obtaining informed consent, the colonoscope was                         passed under direct vision. Throughout the procedure,                         the patient's blood pressure,  pulse, and oxygen                         saturations were monitored continuously. The                         Colonoscope was introduced through the anus and                         advanced to the the cecum, identified by the                          appendiceal orifice. The colonoscopy was performed                         with ease. The patient tolerated the procedure well.                         The quality of the bowel preparation was excellent.                         The ileocecal valve, appendiceal orifice, and rectum                         were photographed. Findings:      The perianal and digital rectal examinations were normal.      An 8 mm polyp was found in the cecum. The polyp was sessile. The polyp       was removed with a cold snare. Resection and retrieval were complete. To       prevent bleeding after the polypectomy, one hemostatic clip was       successfully placed. Clip manufacturer: [employer]. There was no       bleeding at the end of the procedure.      The exam was otherwise without abnormality on direct and retroflexion       views. Impression:            - One 8 mm polyp in the cecum, removed with a cold                         snare. Resected and retrieved. Clip was placed. Clip                         manufacturer: [employer].                        - The examination was otherwise normal on direct and                         retroflexion views. Recommendation:        - Discharge patient to home (with escort).                        - Resume previous diet.                        - Continue present medications.                        - Await pathology  results.                        - Repeat colonoscopy in 5 years for surveillance. Procedure Code(s):     --- Professional ---                        [phone], Colonoscopy, flexible; with removal of                         tumor(s), polyp(s), or other lesion(s) by snare                         technique Diagnosis Code(s):     --- Professional ---                        Z86.010, Personal history of colonic polyps                        D12.0, Benign neoplasm of cecum CPT copyright 2022 American Medical Association. All rights reserved. The codes  documented in this report are preliminary and upon coder review may  be revised to meet current compliance requirements. [name], MD [name] MD, MD 08/29/2023 8:15:12 AM This report has been signed electronically. Number of Addenda: 0 Note Initiated On: 08/29/2023 7:11 AM Scope Withdrawal Time: 0 hours 16 minutes 11 seconds  Total Procedure Duration: 0 hours 20 minutes 19 seconds  Estimated Blood Loss:  Estimated blood loss: none.      [facility]

## 2023-08-29 NOTE — Anesthesia Procedure Notes (Signed)
Date/Time: 08/29/2023 7:53 AM  Performed by: Malva Cogan, CRNAPre-anesthesia Checklist: Patient identified, Emergency Drugs available, Suction available, Patient being monitored and Timeout performed Patient Re-evaluated:Patient Re-evaluated prior to induction Oxygen Delivery Method: Nasal cannula Induction Type: IV induction Placement Confirmation: CO2 detector and positive ETCO2

## 2023-08-29 NOTE — Transfer of Care (Signed)
Immediate Anesthesia Transfer of Care Note  Patient: Jason Duncan  Procedure(s) Performed: COLONOSCOPY WITH PROPOFOL POLYPECTOMY  Patient Location: PACU  Anesthesia Type:General  Level of Consciousness: drowsy  Airway & Oxygen Therapy: Patient Spontanous Breathing  Post-op Assessment: Report given to RN and Post -op Vital signs reviewed and stable  Post vital signs: Reviewed and stable  Last Vitals:  Vitals Value Taken Time  BP    Temp    Pulse    Resp    SpO2      Last Pain: There were no vitals filed for this visit.       Complications: No notable events documented.

## 2023-08-29 NOTE — H&P (Signed)
[name], MD [address], [address], [place], [place], [address] [address], [address], [place], [place], [address] Phone: [phone]  Fax: [phone]  Primary Care Physician:  [name], PA-C   Pre-Procedure History & Physical: HPI:  Jason Duncan is a 57 y.o. male is here for an colonoscopy.   Past Medical History:  Diagnosis Date   Erectile dysfunction    Erectile dysfunction 06/24/2017   GERD (gastroesophageal reflux disease)    History of kidney stones    Kidney stones     Past Surgical History:  Procedure Laterality Date   COLONOSCOPY WITH PROPOFOL N/A 08/03/2019   Procedure: COLONOSCOPY WITH PROPOFOL;  Surgeon: [name], MD;  Location: [facility] ENDOSCOPY;  Service: Gastroenterology;  Laterality: N/A;   EXTERNAL EAR SURGERY Bilateral 1973   LEG SURGERY Right 1988   rods placed due to broken leg   SHOULDER SURGERY Right 2003    Prior to Admission medications   Medication Sig Start Date End Date Taking? Authorizing Provider  atorvastatin (LIPITOR) 10 MG tablet Take 1 tablet (10 mg total) by mouth at bedtime. Patient not taking: Reported on 01/20/2023 07/23/22   [name], PA-C  losartan (COZAAR) 25 MG tablet Take 1 tablet by mouth once daily 07/21/23   [name], DO  pantoprazole (PROTONIX) 40 MG tablet Take 1 tablet (40 mg total) by mouth 2 (two) times daily. Patient not taking: Reported on 01/20/2023 01/28/22   [name], PA-C  tadalafil (CIALIS) 20 MG tablet Take 0.5-1 tablets (10-20 mg total) by mouth every other day as needed for erectile dysfunction. 07/29/23   Mecum, Erin E, PA-C    Allergies as of 01/21/2023 - Review Complete 01/20/2023  Allergen Reaction Noted   Elemental sulfur  06/24/2017    Family History  Problem Relation Age of Onset   Hypertension Mother    Pneumonia Maternal Grandmother    Cancer Maternal Grandfather        lung   Heart disease Brother        58 years old, died from a heart attack    Social History    Socioeconomic History   Marital status: Married    Spouse name: [name]    Number of children: 3   Years of education: Not on file   Highest education level: Some college, no degree  Occupational History   Not on file  Tobacco Use   Smoking status: Never   Smokeless tobacco: Current    Types: Chew  Vaping Use   Vaping status: Never Used  Substance and Sexual Activity   Alcohol use: No   Drug use: No    Comment: used to smoke pot from [age] y old    Sexual activity: Yes  Other Topics Concern   Not on file  Social History Narrative   Lives at home with wife   Social Drivers of Health   Financial Resource Strain: Low Risk  (07/22/2022)   Overall Financial Resource Strain (CARDIA)    Difficulty of Paying Living Expenses: Not hard at all  Food Insecurity: No Food Insecurity (07/22/2022)   Hunger Vital Sign    Worried About Running Out of Food in the Last Year: Never true    Ran Out of Food in the Last Year: Never true  Transportation Needs: No Transportation Needs (07/22/2022)   PRAPARE - Administrator, Civil Service (Medical): No    Lack of Transportation (Non-Medical): No  Physical Activity: Insufficiently Active (07/22/2022)   Exercise  Vital Sign    Days of Exercise per Week: 2 days    Minutes of Exercise per Session: 30 min  Stress: Stress Concern Present (07/22/2022)   [employer] of Occupational Health - Occupational Stress Questionnaire    Feeling of Stress : To some extent  Social Connections: Unknown (07/22/2022)   Social Connection and Isolation Panel [NHANES]    Frequency of Communication with Friends and Family: More than three times a week    Frequency of Social Gatherings with Friends and Family: Twice a week    Attends Religious Services: Not on Marketing executive or Organizations: No    Attends Banker Meetings: Never    Marital Status: Married  Catering manager Violence: Not At Risk (07/22/2022)   Humiliation,  Afraid, Rape, and Kick questionnaire    Fear of Current or Ex-Partner: No    Emotionally Abused: No    Physically Abused: No    Sexually Abused: No    Review of Systems: See HPI, otherwise negative ROS  Physical Exam: There were no vitals taken for this visit. General:   Alert,  pleasant and cooperative in NAD Head:  Normocephalic and atraumatic. Neck:  Supple; no masses or thyromegaly. Lungs:  Clear throughout to auscultation, normal respiratory effort.    Heart:  +S1, +S2, Regular rate and rhythm, No edema. Abdomen:  Soft, nontender and nondistended. Normal bowel sounds, without guarding, and without rebound.   Neurologic:  Alert and  oriented x4;  grossly normal neurologically.  Impression/Plan: [name] is here for an colonoscopy to be performed for surveillance due to prior history of colon polyps   Risks, benefits, limitations, and alternatives regarding  colonoscopy have been reviewed with the patient.  Questions have been answered.  All parties agreeable.   [name], MD  08/29/2023, 7:48 AM

## 2023-09-01 ENCOUNTER — Encounter: Payer: Self-pay | Admitting: Gastroenterology

## 2023-09-01 LAB — SURGICAL PATHOLOGY

## 2023-09-02 ENCOUNTER — Encounter: Payer: Self-pay | Admitting: Gastroenterology

## 2023-09-28 ENCOUNTER — Other Ambulatory Visit: Payer: Self-pay | Admitting: Internal Medicine

## 2023-09-28 DIAGNOSIS — I10 Essential (primary) hypertension: Secondary | ICD-10-CM

## 2023-09-30 NOTE — Telephone Encounter (Signed)
 Requested Prescriptions  Pending Prescriptions Disp Refills   losartan  (COZAAR ) 25 MG tablet [Pharmacy Med Name: Losartan  Potassium 25 MG Oral Tablet] 90 tablet 0    Sig: Take 1 tablet by mouth once daily     Cardiovascular:  Angiotensin Receptor Blockers Passed - 09/30/2023 10:18 AM      Passed - Cr in normal range and within 180 days    Creat  Date Value Ref Range Status  07/29/2023 0.96 0.70 - 1.30 mg/dL Final         Passed - K in normal range and within 180 days    Potassium  Date Value Ref Range Status  07/29/2023 4.9 3.5 - 5.3 mmol/L Final  07/27/2012 3.7 3.5 - 5.1 mmol/L Final         Passed - Patient is not pregnant      Passed - Last BP in normal range    BP Readings from Last 1 Encounters:  08/29/23 123/87         Passed - Valid encounter within last 6 months    Recent Outpatient Visits           2 months ago Annual physical exam   [facility] Mecum, Rocky BRAVO, PA-C   8 months ago Primary hypertension   [facility] Mecum, Rocky BRAVO, PA-C   1 year ago Annual physical exam   [facility] Leavy Mole, PA-C   1 year ago Gastroesophageal reflux disease, unspecified whether esophagitis present   [facility] Leavy Mole, PA-C   1 year ago Tobacco chew use   [facility] Leavy Mole, PA-C

## 2024-01-26 ENCOUNTER — Other Ambulatory Visit: Payer: Self-pay | Admitting: Family Medicine

## 2024-01-26 DIAGNOSIS — I10 Essential (primary) hypertension: Secondary | ICD-10-CM

## 2024-01-27 NOTE — Telephone Encounter (Signed)
 Last CPE Nov. 2024  Requested Prescriptions  Pending Prescriptions Disp Refills   losartan  (COZAAR ) 25 MG tablet [Pharmacy Med Name: Losartan  Potassium 25 MG Oral Tablet] 90 tablet 0    Sig: Take 1 tablet by mouth once daily     Cardiovascular:  Angiotensin Receptor Blockers Failed - 01/27/2024  3:56 PM      Failed - Cr in normal range and within 180 days    Creat  Date Value Ref Range Status  07/29/2023 0.96 0.70 - 1.30 mg/dL Final         Failed - K in normal range and within 180 days    Potassium  Date Value Ref Range Status  07/29/2023 4.9 3.5 - 5.3 mmol/L Final  07/27/2012 3.7 3.5 - 5.1 mmol/L Final         Failed - Valid encounter within last 6 months    Recent Outpatient Visits   None            Passed - Patient is not pregnant      Passed - Last BP in normal range    BP Readings from Last 1 Encounters:  08/29/23 123/87

## 2024-04-25 ENCOUNTER — Other Ambulatory Visit: Payer: Self-pay | Admitting: Family Medicine

## 2024-04-25 DIAGNOSIS — I10 Essential (primary) hypertension: Secondary | ICD-10-CM

## 2024-04-28 NOTE — Telephone Encounter (Signed)
 Requested Prescriptions  Pending Prescriptions Disp Refills   losartan  (COZAAR ) 25 MG tablet [Pharmacy Med Name: Losartan  Potassium 25 MG Oral Tablet] 90 tablet 0    Sig: Take 1 tablet by mouth once daily     Cardiovascular:  Angiotensin Receptor Blockers Failed - 04/28/2024  1:00 PM      Failed - Cr in normal range and within 180 days    Creat  Date Value Ref Range Status  07/29/2023 0.96 0.70 - 1.30 mg/dL Final         Failed - K in normal range and within 180 days    Potassium  Date Value Ref Range Status  07/29/2023 4.9 3.5 - 5.3 mmol/L Final  07/27/2012 3.7 3.5 - 5.1 mmol/L Final         Failed - Valid encounter within last 6 months    Recent Outpatient Visits   None            Passed - Patient is not pregnant      Passed - Last BP in normal range    BP Readings from Last 1 Encounters:  08/29/23 123/87

## 2024-08-02 ENCOUNTER — Encounter: Admitting: Family Medicine

## 2024-08-10 ENCOUNTER — Encounter: Payer: Self-pay | Admitting: Nurse Practitioner

## 2024-08-10 ENCOUNTER — Ambulatory Visit (INDEPENDENT_AMBULATORY_CARE_PROVIDER_SITE_OTHER): Admitting: Nurse Practitioner

## 2024-08-10 VITALS — BP 126/74 | HR 70 | Resp 16 | Ht 69.0 in | Wt 209.0 lb

## 2024-08-10 DIAGNOSIS — Z0000 Encounter for general adult medical examination without abnormal findings: Secondary | ICD-10-CM | POA: Diagnosis not present

## 2024-08-10 DIAGNOSIS — Z125 Encounter for screening for malignant neoplasm of prostate: Secondary | ICD-10-CM | POA: Diagnosis not present

## 2024-08-10 DIAGNOSIS — E785 Hyperlipidemia, unspecified: Secondary | ICD-10-CM | POA: Diagnosis not present

## 2024-08-10 DIAGNOSIS — I10 Essential (primary) hypertension: Secondary | ICD-10-CM

## 2024-08-10 DIAGNOSIS — Z131 Encounter for screening for diabetes mellitus: Secondary | ICD-10-CM

## 2024-08-10 NOTE — Progress Notes (Signed)
 Name: Jason Duncan   MRN: [id number]    DOB: February 18, 1966   Date:08/10/2024       Progress Note  Subjective  Chief Complaint  Chief Complaint  Patient presents with   Annual Exam    HPI  Patient presents for annual CPE. Discussed the use of AI scribe software for clinical note transcription with the patient, who gave verbal consent to proceed.  History of Present Illness STORM SOVINE is a 58 year old male who presents for an annual physical exam.  Cardiovascular risk factors - Hypertension managed with losartan  25 mg daily - Hyperlipidemia no longer taking  atorvastatin  10 mg daily  Colorectal cancer screening - Up to date on colorectal cancer screenings  Lifestyle and preventive health - Diet is not well-balanced; consumes preferred foods without restriction - Engages in daily walking for exercise - Obtains approximately six hours of sleep per night  Vision - Uses glasses for reading - No issues with distance vision  Genitourinary and gastrointestinal symptoms - No problems with urination or bowel movements  Hernia - No current symptoms or issues related to hernia  Dental health - Last dental exam missed due to feeling unwell, possibly with a cold      Diet: does not eat well balanced diet, eats what he wants to Exercise: walking daily Sleep: 6 hours Last dental exam:been awhile ago, needs to schedule Last eye exam: needs to schedule  Depression: phq 9 is negative    08/10/2024    9:09 AM 07/29/2023   12:52 PM 01/20/2023    2:09 PM 07/22/2022    1:55 PM 01/28/2022    9:05 AM  Depression screen PHQ 2/9  Decreased Interest 0 1 1 0 0  Down, Depressed, Hopeless 0 1 1 0 0  PHQ - 2 Score 0 2 2 0 0  Altered sleeping  1 3 0 0  Tired, decreased energy  1 1 0 0  Change in appetite  0 0 0 0  Feeling bad or failure about yourself   0 0 0 0  Trouble concentrating  0 1 0 0  Moving slowly or fidgety/restless  0 0 0 0  Suicidal thoughts  0 0 0 0  PHQ-9 Score   4  7  0  0   Difficult doing work/chores  Somewhat difficult Somewhat difficult Not difficult at all      Data saved with a previous flowsheet row definition    Hypertension:  BP Readings from Last 3 Encounters:  08/10/24 126/74  08/29/23 123/87  07/29/23 132/84    Obesity: Wt Readings from Last 3 Encounters:  08/10/24 209 lb (94.8 kg)  08/29/23 198 lb 3.2 oz (89.9 kg)  07/29/23 202 lb 6.4 oz (91.8 kg)   BMI Readings from Last 3 Encounters:  08/10/24 30.86 kg/m  08/29/23 29.27 kg/m  07/29/23 29.89 kg/m     Lipids:  Lab Results  Component Value Date   CHOL 172 07/29/2023   CHOL 168 07/22/2022   CHOL 153 07/19/2021   Lab Results  Component Value Date   HDL 38 (L) 07/29/2023   HDL 38 (L) 07/22/2022   HDL 38 (L) 07/19/2021   Lab Results  Component Value Date   LDLCALC 106 (H) 07/29/2023   LDLCALC 100 (H) 07/22/2022   LDLCALC 99 07/19/2021   Lab Results  Component Value Date   TRIG 164 (H) 07/29/2023   TRIG 179 (H) 07/22/2022   TRIG 74 07/19/2021   Lab Results  Component Value Date   CHOLHDL 4.5 07/29/2023   CHOLHDL 4.4 07/22/2022   CHOLHDL 4.0 07/19/2021   No results found for: LDLDIRECT Glucose:  Glucose  Date Value Ref Range Status  07/27/2012 100 (H) 65 - 99 mg/dL Final   Glucose, Bld  Date Value Ref Range Status  07/29/2023 89 65 - 99 mg/dL Final    Comment:    .            Fasting reference interval .   07/22/2022 69 65 - 99 mg/dL Final    Comment:    .            Fasting reference interval .   12/20/2021 79 65 - 99 mg/dL Final    Comment:    .            Fasting reference interval .     Flowsheet Row Office Visit from 08/10/2024 in [facility]  AUDIT-C Score 0     Married STD testing and prevention (HIV/chl/gon/syphilis): completed Hep C: completed  Skin cancer: Discussed monitoring for atypical lesions Colorectal cancer: 08/29/2023 Prostate cancer: due Lab Results  Component Value Date    PSA 1.45 07/29/2023   PSA 1.50 07/22/2022   PSA 1.05 07/19/2021     Lung cancer:   Low Dose CT Chest recommended if Age 27-80 years, 30 pack-year currently smoking OR have quit w/in 15years. Patient does not qualify.   AAA:  The USPSTF recommends one-time screening with ultrasonography in men ages 24 to 44 years who have ever smoked ECG:  04/30/2021  Vaccines:  HPV: up to at age 34 , ask insurance if age between 21-45  Shingrix: 44-64 yo and ask insurance if covered when patient above 68 yo Pneumonia:  educated and discussed with patient. Flu:  educated and discussed with patient.  Advanced Care Planning: A voluntary discussion about advance care planning including the explanation and discussion of advance directives.  Discussed health care proxy and Living will, and the patient was able to identify a health care proxy as wife.  Patient does not have a living will at present time. If patient does have living will, I have requested they bring this to the clinic to be scanned in to their chart.  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   Hiatal hernia with GERD 03/02/2019   Erectile dysfunction 06/24/2017   Vitamin D  deficiency 06/24/2017    Past Surgical History:  Procedure Laterality Date   COLONOSCOPY WITH PROPOFOL  N/A 08/03/2019   Procedure: COLONOSCOPY WITH PROPOFOL ;  Surgeon: Therisa Bi, MD;  Location: [facility] ENDOSCOPY;  Service: Gastroenterology;  Laterality: N/A;   COLONOSCOPY WITH PROPOFOL  N/A 08/29/2023   Procedure: COLONOSCOPY WITH PROPOFOL ;  Surgeon: Therisa Bi, MD;  Location: [facility] ENDOSCOPY;  Service: Gastroenterology;  Laterality: N/A;   EXTERNAL EAR SURGERY  Bilateral 1973   LEG SURGERY Right 1988   rods placed due to broken leg   POLYPECTOMY  08/29/2023   Procedure: POLYPECTOMY;  Surgeon: Therisa Bi, MD;  Location: [facility] ENDOSCOPY;  Service: Gastroenterology;;   SHOULDER SURGERY Right 2003    Family History  Problem Relation Age of Onset   Hypertension Mother  Pneumonia Maternal Grandmother    Cancer Maternal Grandfather        lung   Heart disease Brother        10 years old, died from a heart attack    Social History   Socioeconomic History   Marital status: Married    Spouse name: Rosaline    Number of children: 3   Years of education: Not on file   Highest education level: Some college, no degree  Occupational History   Not on file  Tobacco Use   Smoking status: Never   Smokeless tobacco: Current    Types: Chew  Vaping Use   Vaping status: Never Used  Substance and Sexual Activity   Alcohol use: No   Drug use: No    Comment: used to smoke pot from [age] y old    Sexual activity: Yes  Other Topics Concern   Not on file  Social History Narrative   Lives at home with wife   Social Drivers of Health   Financial Resource Strain: Low Risk  (08/10/2024)   Overall Financial Resource Strain (CARDIA)    Difficulty of Paying Living Expenses: Not hard at all  Food Insecurity: No Food Insecurity (08/10/2024)   Hunger Vital Sign    Worried About Running Out of Food in the Last Year: Never true    Ran Out of Food in the Last Year: Never true  Transportation Needs: No Transportation Needs (08/10/2024)   PRAPARE - Administrator, Civil Service (Medical): No    Lack of Transportation (Non-Medical): No  Physical Activity: Insufficiently Active (08/10/2024)   Exercise Vital Sign    Days of Exercise per Week: 2 days    Minutes of Exercise per Session: 30 min  Stress: No Stress Concern Present (08/10/2024)   [employer] of Occupational Health - Occupational Stress Questionnaire    Feeling of Stress: Only a  little  Social Connections: Moderately Integrated (08/10/2024)   Social Connection and Isolation Panel    Frequency of Communication with Friends and Family: More than three times a week    Frequency of Social Gatherings with Friends and Family: Twice a week    Attends Religious Services: 1 to 4 times per year    Active Member of [employer] or Organizations: No    Attends Banker Meetings: Never    Marital Status: Married  Catering Manager Violence: Not At Risk (08/10/2024)   Humiliation, Afraid, Rape, and Kick questionnaire    Fear of Current or Ex-Partner: No    Emotionally Abused: No    Physically Abused: No    Sexually Abused: No     Current Outpatient Medications:    atorvastatin  (LIPITOR) 10 MG tablet, Take 1 tablet (10 mg total) by mouth at bedtime. (Patient not taking: Reported on 08/10/2024), Disp: 90 tablet, Rfl: 3   losartan  (COZAAR ) 25 MG tablet, Take 1 tablet by mouth once daily, Disp: 90 tablet, Rfl: 0   tadalafil  (CIALIS ) 20 MG tablet, Take 0.5-1 tablets (10-20 mg total) by mouth every other day as needed for erectile dysfunction., Disp: 10 tablet, Rfl: 5   pantoprazole  (PROTONIX ) 40 MG tablet, Take 1 tablet (40 mg total) by mouth 2 (two) times daily. (Patient not taking: Reported on 08/10/2024), Disp: 60 tablet, Rfl: 3  Allergies  Allergen Reactions   Elemental Sulfur      ROS  Constitutional: Negative for fever or weight change.  Respiratory: Negative for cough and shortness of breath.  Cardiovascular: Negative for chest pain or palpitations.  Gastrointestinal: Negative for abdominal pain, no bowel changes.  Musculoskeletal: Negative for gait problem or joint swelling.  Skin: Negative for rash.  Neurological: Negative for dizziness or headache.  No other specific complaints in a complete review of systems (except as listed in HPI above).    Objective  Vitals:   08/10/24 0909  BP: 126/74  Pulse: 70  Resp: 16  SpO2: 96%  Weight: 209 lb (94.8  kg)  Height: 5' 9 (1.753 m)    Body mass index is 30.86 kg/m.  Physical Exam Vitals reviewed.  Constitutional:      Appearance: Normal appearance.  HENT:     Head: Normocephalic.     Right Ear: Tympanic membrane normal.     Left Ear: Tympanic membrane normal.     Nose: Nose normal.  Eyes:     Extraocular Movements: Extraocular movements intact.     Conjunctiva/sclera: Conjunctivae normal.     Pupils: Pupils are equal, round, and reactive to light.  Neck:     Thyroid : No thyroid  mass, thyromegaly or thyroid  tenderness.  Cardiovascular:     Rate and Rhythm: Normal rate and regular rhythm.     Pulses: Normal pulses.     Heart sounds: Normal heart sounds.  Pulmonary:     Effort: Pulmonary effort is normal.     Breath sounds: Normal breath sounds.  Abdominal:     General: Bowel sounds are normal.     Palpations: Abdomen is soft.  Musculoskeletal:        General: Normal range of motion.     Cervical back: Normal range of motion and neck supple.     Right lower leg: No edema.     Left lower leg: No edema.  Skin:    General: Skin is warm and dry.     Capillary Refill: Capillary refill takes less than 2 seconds.  Neurological:     General: No focal deficit present.     Mental Status: He is alert and oriented to person, place, and time. Mental status is at baseline.  Psychiatric:        Mood and Affect: Mood normal.        Behavior: Behavior normal.        Thought Content: Thought content normal.        Judgment: Judgment normal.      No results found for this or any previous visit (from the past 2160 hours).   Fall Risk:    08/10/2024    9:08 AM 07/29/2023   12:52 PM 01/20/2023    2:08 PM 07/22/2022    1:55 PM 01/28/2022    9:04 AM  Fall Risk   Falls in the past year? 0 0 0 0 0  Number falls in past yr: 0 0 0 0 0  Injury with Fall? 0 0 0 0 0  Risk for fall due to : No Fall Risks No Fall Risks No Fall Risks No Fall Risks No Fall Risks  Follow up Falls prevention  discussed Falls prevention discussed;Education provided;Falls evaluation completed Falls prevention discussed;Education provided;Falls evaluation completed Falls evaluation completed;Education provided;Falls prevention discussed  Falls prevention discussed      Data saved with a previous flowsheet row definition      Functional Status Survey: Is the patient deaf or have difficulty hearing?: No Does the patient have difficulty seeing, even when wearing glasses/contacts?: No Does the patient have difficulty concentrating, remembering, or making decisions?: No  Does the patient have difficulty walking or climbing stairs?: No Does the patient have difficulty dressing or bathing?: No Does the patient have difficulty doing errands alone such as visiting a doctor's office or shopping?: No    Assessment & Plan  Problem List Items Addressed This Visit       Cardiovascular and Mediastinum   Primary hypertension   Relevant Orders   CBC with Differential/Platelet   Comprehensive metabolic panel with GFR     Other   Hyperlipidemia   Relevant Orders   Lipid panel   Other Visit Diagnoses       Annual physical exam    -  Primary   Relevant Orders   CBC with Differential/Platelet   Comprehensive metabolic panel with GFR   Lipid panel   PSA   Hemoglobin A1c     Screening for prostate cancer       Relevant Orders   PSA     Screening for diabetes mellitus       Relevant Orders   Comprehensive metabolic panel with GFR   Hemoglobin A1c      Assessment and Plan Assessment & Plan Adult Wellness Visit Annual physical examination conducted. Blood pressure is well-controlled at 126/74 mmHg. Up to date on colorectal cancer screenings. Reports a diet that is not well-balanced, but engages in regular walking. Sleeps approximately six hours per night. Last dental exam was missed due to feeling unwell. Requires new glasses for reading. No issues with vision at a distance. No problems with  urination. Hernia present but asymptomatic. - Ordered labs  Essential hypertension Hypertension is well-controlled with current medication regimen. Blood pressure today is 126/74 mmHg. - Continue losartan  25 mg daily  Hyperlipidemia Managed with atorvastatin . No new issues reported. -previously prescribed atorvastatin  10 mg daily, not taking currently  Screening for prostate cancer Due for prostate cancer screening. - Ordered PSA test     -Prostate cancer screening and PSA options (with potential risks and benefits of testing vs not testing) were discussed along with recent recs/guidelines. -USPSTF grade A and B recommendations reviewed with patient; age-appropriate recommendations, preventive care, screening tests, etc discussed and encouraged; healthy living encouraged; see AVS for patient education given to patient -Discussed importance of 150 minutes of physical activity weekly, eat two servings of fish weekly, eat one serving of tree nuts ( cashews, pistachios, pecans, almonds.SABRA) every other day, eat 6 servings of fruit/vegetables daily and drink plenty of water  and avoid sweet beverages.  -Reviewed Health Maintenance: yes

## 2024-08-11 ENCOUNTER — Ambulatory Visit: Payer: Self-pay | Admitting: Nurse Practitioner

## 2024-08-11 LAB — HEMOGLOBIN A1C
Hgb A1c MFr Bld: 5.5 % (ref <5.7)
Mean Plasma Glucose: 111 mg/dL
eAG (mmol/L): 6.2 mmol/L

## 2024-08-11 LAB — LIPID PANEL
Cholesterol: 149 mg/dL (ref <200)
HDL: 38 mg/dL — ABNORMAL LOW (ref >=40)
LDL Cholesterol (Calc): 91 mg/dL
Non-HDL Cholesterol (Calc): 111 mg/dL (ref <130)
Total CHOL/HDL Ratio: 3.9 (calc) (ref <5.0)
Triglycerides: 105 mg/dL (ref <150)

## 2024-08-11 LAB — COMPREHENSIVE METABOLIC PANEL WITH GFR
AG Ratio: 1.9 (calc) (ref 1.0–2.5)
ALT: 17 U/L (ref 9–46)
AST: 18 U/L (ref 10–35)
Albumin: 4.7 g/dL (ref 3.6–5.1)
Alkaline phosphatase (APISO): 64 U/L (ref 35–144)
BUN: 16 mg/dL (ref 7–25)
CO2: 28 mmol/L (ref 20–32)
Calcium: 10.2 mg/dL (ref 8.6–10.3)
Chloride: 105 mmol/L (ref 98–110)
Creat: 0.97 mg/dL (ref 0.70–1.30)
Globulin: 2.5 g/dL (ref 1.9–3.7)
Glucose, Bld: 96 mg/dL (ref 65–99)
Potassium: 5.3 mmol/L (ref 3.5–5.3)
Sodium: 141 mmol/L (ref 135–146)
Total Bilirubin: 0.7 mg/dL (ref 0.2–1.2)
Total Protein: 7.2 g/dL (ref 6.1–8.1)
eGFR: 91 mL/min/1.73m2 (ref >=60)

## 2024-08-11 LAB — CBC WITH DIFFERENTIAL/PLATELET
Absolute Lymphocytes: 1359 {cells}/uL (ref 850–3900)
Absolute Monocytes: 329 {cells}/uL (ref 200–950)
Basophils Absolute: 41 {cells}/uL (ref 0–200)
Basophils Relative: 0.9 %
Eosinophils Absolute: 32 {cells}/uL (ref 15–500)
Eosinophils Relative: 0.7 %
HCT: 45.5 % (ref 39.4–51.1)
Hemoglobin: 15.5 g/dL (ref 13.2–17.1)
MCH: 31.2 pg (ref 27.0–33.0)
MCHC: 34.1 g/dL (ref 31.6–35.4)
MCV: 91.5 fL (ref 81.4–101.7)
MPV: 9.5 fL (ref 7.5–12.5)
Monocytes Relative: 7.3 %
Neutro Abs: 2741 {cells}/uL (ref 1500–7800)
Neutrophils Relative %: 60.9 %
Platelets: 303 Thousand/uL (ref 140–400)
RBC: 4.97 Million/uL (ref 4.20–5.80)
RDW: 12.6 % (ref 11.0–15.0)
Total Lymphocyte: 30.2 %
WBC: 4.5 Thousand/uL (ref 3.8–10.8)

## 2024-08-11 LAB — PSA: PSA: 1.32 ng/mL (ref <=4.00)

## 2025-08-15 ENCOUNTER — Encounter: Admitting: Nurse Practitioner
